# Patient Record
Sex: Male | Born: 1940 | Race: White | Hispanic: No | Marital: Married | Smoking: Current every day smoker
Health system: Southern US, Community
[De-identification: ages and names within clinical notes are randomized; demographics above are authoritative.]

## PROBLEM LIST (undated history)

## (undated) DIAGNOSIS — R197 Diarrhea, unspecified: Secondary | ICD-10-CM

## (undated) DIAGNOSIS — K589 Irritable bowel syndrome without diarrhea: Secondary | ICD-10-CM

## (undated) DIAGNOSIS — M79606 Pain in leg, unspecified: Secondary | ICD-10-CM

## (undated) DIAGNOSIS — I10 Essential (primary) hypertension: Secondary | ICD-10-CM

## (undated) DIAGNOSIS — I6529 Occlusion and stenosis of unspecified carotid artery: Secondary | ICD-10-CM

## (undated) DIAGNOSIS — E785 Hyperlipidemia, unspecified: Secondary | ICD-10-CM

## (undated) DIAGNOSIS — G459 Transient cerebral ischemic attack, unspecified: Secondary | ICD-10-CM

## (undated) HISTORY — DX: Occlusion and stenosis of unspecified carotid artery: I65.29

## (undated) HISTORY — DX: Hyperlipidemia, unspecified: E78.5

## (undated) HISTORY — DX: Diarrhea, unspecified: R19.7

## (undated) HISTORY — PX: STRABISMUS SURGERY: SHX218

## (undated) HISTORY — DX: Essential (primary) hypertension: I10

## (undated) HISTORY — DX: Transient cerebral ischemic attack, unspecified: G45.9

## (undated) HISTORY — DX: Pain in leg, unspecified: M79.606

## (undated) HISTORY — DX: Irritable bowel syndrome, unspecified: K58.9

---

## 2002-05-03 ENCOUNTER — Encounter: Payer: Self-pay | Admitting: Cardiology

## 2003-09-28 ENCOUNTER — Ambulatory Visit (HOSPITAL_COMMUNITY): Admission: RE | Admit: 2003-09-28 | Discharge: 2003-09-28 | Payer: Self-pay | Admitting: Family Medicine

## 2009-01-03 ENCOUNTER — Ambulatory Visit (HOSPITAL_COMMUNITY): Admission: RE | Admit: 2009-01-03 | Discharge: 2009-01-03 | Payer: Self-pay | Admitting: Family Medicine

## 2009-01-03 ENCOUNTER — Encounter: Payer: Self-pay | Admitting: Cardiology

## 2009-09-04 ENCOUNTER — Ambulatory Visit: Payer: Self-pay | Admitting: Cardiology

## 2009-09-04 ENCOUNTER — Encounter: Payer: Self-pay | Admitting: Cardiology

## 2009-09-05 ENCOUNTER — Encounter: Payer: Self-pay | Admitting: Cardiology

## 2009-09-06 ENCOUNTER — Encounter: Payer: Self-pay | Admitting: Cardiology

## 2009-09-09 ENCOUNTER — Encounter: Payer: Self-pay | Admitting: Cardiology

## 2009-09-09 DIAGNOSIS — G459 Transient cerebral ischemic attack, unspecified: Secondary | ICD-10-CM | POA: Insufficient documentation

## 2009-09-09 DIAGNOSIS — I739 Peripheral vascular disease, unspecified: Secondary | ICD-10-CM | POA: Insufficient documentation

## 2009-09-09 DIAGNOSIS — M79609 Pain in unspecified limb: Secondary | ICD-10-CM

## 2009-09-09 DIAGNOSIS — I10 Essential (primary) hypertension: Secondary | ICD-10-CM | POA: Insufficient documentation

## 2009-09-09 DIAGNOSIS — D649 Anemia, unspecified: Secondary | ICD-10-CM | POA: Insufficient documentation

## 2009-09-10 ENCOUNTER — Ambulatory Visit (HOSPITAL_COMMUNITY): Admission: RE | Admit: 2009-09-10 | Discharge: 2009-09-10 | Payer: Self-pay | Admitting: Family Medicine

## 2009-09-16 ENCOUNTER — Encounter: Payer: Self-pay | Admitting: Cardiology

## 2009-09-16 ENCOUNTER — Ambulatory Visit: Payer: Self-pay | Admitting: Surgery

## 2009-09-19 ENCOUNTER — Encounter: Payer: Self-pay | Admitting: Cardiology

## 2009-09-19 DIAGNOSIS — N259 Disorder resulting from impaired renal tubular function, unspecified: Secondary | ICD-10-CM | POA: Insufficient documentation

## 2009-09-20 ENCOUNTER — Encounter: Payer: Self-pay | Admitting: Cardiology

## 2009-09-20 ENCOUNTER — Ambulatory Visit: Payer: Self-pay

## 2009-09-20 DIAGNOSIS — K551 Chronic vascular disorders of intestine: Secondary | ICD-10-CM | POA: Insufficient documentation

## 2009-09-24 ENCOUNTER — Ambulatory Visit: Payer: Self-pay | Admitting: Cardiology

## 2009-09-24 DIAGNOSIS — F17200 Nicotine dependence, unspecified, uncomplicated: Secondary | ICD-10-CM

## 2009-09-24 DIAGNOSIS — R930 Abnormal findings on diagnostic imaging of skull and head, not elsewhere classified: Secondary | ICD-10-CM

## 2009-09-24 DIAGNOSIS — E785 Hyperlipidemia, unspecified: Secondary | ICD-10-CM

## 2009-09-24 DIAGNOSIS — R55 Syncope and collapse: Secondary | ICD-10-CM

## 2009-09-24 DIAGNOSIS — I6529 Occlusion and stenosis of unspecified carotid artery: Secondary | ICD-10-CM

## 2009-10-03 ENCOUNTER — Ambulatory Visit: Payer: Self-pay | Admitting: Surgery

## 2009-10-03 ENCOUNTER — Encounter: Payer: Self-pay | Admitting: Surgery

## 2009-10-03 ENCOUNTER — Inpatient Hospital Stay (HOSPITAL_COMMUNITY): Admission: RE | Admit: 2009-10-03 | Discharge: 2009-10-04 | Payer: Self-pay | Admitting: Surgery

## 2009-10-03 HISTORY — PX: CAROTID ENDARTERECTOMY: SUR193

## 2009-10-04 ENCOUNTER — Encounter: Payer: Self-pay | Admitting: Cardiology

## 2009-10-07 ENCOUNTER — Encounter: Payer: Self-pay | Admitting: Cardiology

## 2009-10-07 ENCOUNTER — Ambulatory Visit: Payer: Self-pay | Admitting: Surgery

## 2009-10-14 ENCOUNTER — Ambulatory Visit: Payer: Self-pay | Admitting: Surgery

## 2009-10-14 ENCOUNTER — Encounter: Payer: Self-pay | Admitting: Cardiology

## 2009-10-16 ENCOUNTER — Encounter: Payer: Self-pay | Admitting: Cardiology

## 2009-10-17 ENCOUNTER — Encounter: Payer: Self-pay | Admitting: Cardiology

## 2009-10-17 ENCOUNTER — Ambulatory Visit: Payer: Self-pay

## 2010-01-31 ENCOUNTER — Ambulatory Visit: Payer: Self-pay | Admitting: Cardiology

## 2010-01-31 DIAGNOSIS — J984 Other disorders of lung: Secondary | ICD-10-CM

## 2010-05-05 ENCOUNTER — Ambulatory Visit: Payer: Self-pay | Admitting: Surgery

## 2010-05-05 DIAGNOSIS — R197 Diarrhea, unspecified: Secondary | ICD-10-CM

## 2010-05-05 HISTORY — DX: Diarrhea, unspecified: R19.7

## 2010-09-09 NOTE — Assessment & Plan Note (Signed)
Summary: post hosp   Visit Type:  Follow-up Primary Provider:  Dr. Earley Favor  CC:  syncope.  History of Present Illness: The patient presents for follow up after hospitalization for syncope last month.  He had an extensive work up to include carotid dopplers, echo, nuclear stress testing.  Echo demonstrated an EF of 60% without valvular or other abnormality.  CL demonstrated and EF of 62% with possible fixed apical defect.  Carotid showed right 50% and left 80% stenosis.  The syncope was felt to be vasovagal.  I have reviewed all hospital and outpatient studies and records.  Of note the patient is now due to have a left carotid endarterectomy by Dr. Myra Gianotti.  I do note that a renal ultrasound done in our office suggested possible mesenteric stenosis but no renal artery stenosis. Dr. Andee Lineman suggested possible repeat evaluation with a food challenge but this has not yet been scheduled.  The patient reports that he has had no other cardiovascular problems. He does have claudication which is being evaluated. However, he is not having any chest pressure, neck or arm discomfort. He has had no palpitations, presyncope or syncope. He has had no shortness of breath, PND or orthopnea. Unfortunately he is still smoking cigarettes.  Preoperative Cardiovascular Risk Evaluation:  Clinical Predictors of Increased Perioperative Cardiovascular Risk (MI, CHF, Death):     Minor Risk:    Advanced age (>65 years):     yes   Preventive Screening-Counseling & Management  Alcohol-Tobacco     Smoking Status: current     Packs/Day: 1.5     Year Started: 1951  Current Medications (verified): 1)  Enalapril-Hydrochlorothiazide 10-25 Mg Tabs (Enalapril-Hydrochlorothiazide) .... Take 1 Tablet By Mouth Once A Day 2)  Aspirin 81 Mg Tbec (Aspirin) .... Take One Tablet By Mouth Daily 3)  Simvastatin 40 Mg Tabs (Simvastatin) .... Take One Tablet By Mouth Daily At Bedtime  Allergies (verified): No Known Drug  Allergies  Comments:  Nurse/Medical Assistant: The patient's medications were reviewed with the patient and were updated in the Medication List. Pt verbally confirmed medications.  Cyril Loosen, RN, BSN (September 24, 2009 11:13 AM)  Past History:  Past Medical History: Hypertension x years Dyslipidemia x years Renal Insufficiency Hx of Stroke in 2005 Chronic diarrhea Peripheral arterial disease (Right 50 - 69%, Left 80%)  Past Surgical History: Colon polypectomy, June 2010  Social History: Smoking Status:  current Packs/Day:  1.5  Review of Systems       As stated in the HPI and negative for all other systems.   Vital Signs:  Patient profile:   70 year old male Height:      68 inches Weight:      153.25 pounds BMI:     23.39 Pulse rate:   72 / minute BP sitting:   128 / 83  (left arm) Cuff size:   regular  Vitals Entered By: Cyril Loosen, RN, BSN (September 24, 2009 11:10 AM) CC: syncope Comments Post hospital follow up   Physical Exam  General:  Well developed, well nourished, in no acute distress. Head:  normocephalic and atraumatic Eyes:  PERRLA/EOM intact; conjunctiva and lids normal. Mouth:  Teeth, gums and palate normal. Oral mucosa normal. Neck:  Neck supple, no JVD. No masses, thyromegaly or abnormal cervical nodes. Chest Wall:  no deformities or breast masses noted Lungs:  Clear bilaterally to auscultation and percussion.   Detailed Cardiovascular Exam  Neck    Carotids: Carotids full and equal bilaterally without  bruits.      Neck Veins: Normal, no JVD.    Heart    Inspection: no deformities or lifts noted.      Palpation: normal PMI with no thrills palpable.      Auscultation: regular rate and rhythm, S1, S2 without murmurs, rubs, gallops, or clicks.    Vascular    Abdominal Aorta: no palpable masses, pulsations, or audible bruits.      Femoral Pulses: normal femoral pulses bilaterally.      Pedal Pulses: absent right dorsalis pedis  pulse, absent right posterior tibial pulse, absent left dorsalis pedis pulse, and absent left posterior tibial pulse.      Radial Pulses: normal radial pulses bilaterally.      Peripheral Circulation: no clubbing, cyanosis, or edema noted with normal capillary refill.     Impression & Recommendations:  Problem # 1:  SYNCOPE (ICD-780.2) The patient has had no further events. This was thought to be vasovagal. No further workup is planned.  Problem # 2:  ABNORMAL CHEST XRAY (ICD-793.1) There was a questionable lung nodule noted in the right midlung. I gave a copy of this report to the patient. He is aware that followup is indicated and I will send this note to his primary care doctor. I asked him also to present the printed copy of the chest x-ray at his next appointment. He is aware of these instructions and the need for followup.  Problem # 3:  TOBACCO ABUSE (ICD-305.1) We spent quite a bit of time discussing the need to stop smoking. (Greater than 3 minutes). He wants to try "cold [place]".  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 I will defer further studies to Drs Andee Lineman and Myra Gianotti.  Problem # 5:  RENAL INSUFFICIENCY (ICD-588.9) We discussed his renal insufficiency. This will be followed by his primary physician.  Problem # 6:  DYSLIPIDEMIA (ICD-272.4) I did not see the most recent lipid profile but he tells me his statin was increased in the hospital. The goal should be an LDL less than 70 and HDL greater than 40 given his significant vascular disease.  Patient Instructions: 1)  Your physician recommends that you continue on your current medications as directed. Please refer to the Current Medication list given to you today. 2)  Your physician wants you to follow-up in:4 months with Dr. Andee Lineman.  You will receive a reminder letter in the mail about two months in advance. If you don't receive a letter, please call our office  to schedule the follow-up appointment.

## 2010-09-09 NOTE — Miscellaneous (Signed)
Summary: Orders Update  Clinical Lists Changes  Problems: Added new problem of RENAL INSUFFICIENCY (ICD-588.9) Orders: Added new Referral order of Ultrasound (Ultrasound) - Signed

## 2010-09-09 NOTE — Consult Note (Signed)
Summary: CARDIOLOGY CONSULT/ [facility]  CARDIOLOGY CONSULT/ [facility]   Imported By: Zachary George 09/09/2009 15:31:50  _____________________________________________________________________  External Attachment:    Type:   Image     Comment:   External Document

## 2010-09-09 NOTE — Miscellaneous (Signed)
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

## 2010-09-09 NOTE — Miscellaneous (Signed)
Summary: Orders Update  Clinical Lists Changes  Orders: Added new Test order of Renal Artery Duplex (Renal Artery Duplex) - Signed 

## 2010-09-09 NOTE — Letter (Signed)
Summary: ORDER FOR RENAL US  ORDER FOR RENAL US   Imported By: Zachary George 09/09/2009 15:31:27  _____________________________________________________________________  External Attachment:    Type:   Image     Comment:   External Document

## 2010-09-09 NOTE — Miscellaneous (Signed)
Summary: Orders Update  Clinical Lists Changes  Orders: Added new Test order of Mesenteric (Mesenteric) - Signed 

## 2010-09-09 NOTE — Miscellaneous (Signed)
Summary: MESENTERIC U/S WITH FOOD CHALLANGE  Clinical Lists Changes  Orders: Added new Referral order of Ultrasound (Ultrasound) - Signed

## 2010-09-09 NOTE — Miscellaneous (Signed)
Summary: Orders Update  Clinical Lists Changes 

## 2010-09-09 NOTE — Letter (Signed)
Summary: Internal Correspondence/ [facility]  Internal Correspondence/ [facility]   Imported By: Dorise Hiss 02/05/2010 11:16:19  _____________________________________________________________________  External Attachment:    Type:   Image     Comment:   External Document

## 2010-09-09 NOTE — Assessment & Plan Note (Signed)
Summary: 4 MO FU PER JUNE REMINDER-SRS   Visit Type:  Follow-up Primary Provider:  Dr. Earley Favor   History of Present Illness: the patient is 70 year old male with a history of peripheral vascular disease. The patient had a recent left carotid endarterectomy done byvascular surgery. He also reports lifestyle limiting claudication the right leg. He states that he can only walk for 15-20 minutes in a plowed field. He develops claudication after 100 yards of walking. he reports no chest pain. The patient a Cardiolite imaging study done which was negative for ischemia. Unfortunately continues to smoke one to one and a half packs a day. He also has a pulmonary nodule on his chest x-ray which requires followup. From cardiac standpoint is stable and he denies any chest pain shortness of breath orthopnea PND palpitations or syncope.  Preventive Screening-Counseling & Management  Alcohol-Tobacco     Smoking Status: current     Smoking Cessation Counseling: yes     Packs/Day: 1 PPD  Current Medications (verified): 1)  Enalapril-Hydrochlorothiazide 10-25 Mg Tabs (Enalapril-Hydrochlorothiazide) .... Take 1 Tablet By Mouth Once A Day 2)  Aspirin 81 Mg Tbec (Aspirin) .... Take One Tablet By Mouth Daily 3)  Simvastatin 40 Mg Tabs (Simvastatin) .... Take One Tablet By Mouth Daily At Bedtime 4)  Plavix 75 Mg Tabs (Clopidogrel Bisulfate) .... Take 1 Tablet By Mouth Once A Day  Allergies (verified): No Known Drug Allergies  Comments:  Nurse/Medical Assistant: The patient's medication list and allergies were reviewed with the patient and were updated in the Medication and Allergy Lists.  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 Married  has 5 children from 1st marriage Smoking one pack a day and started age 50 denies alcohol use for the past 15 years but previously drank as much as a six pack of beer per day  Risk Factors: Smoking Status: current (01/31/2010) Packs/Day: 1 PPD (01/31/2010)  Social History: Packs/Day:  1 PPD  Vital Signs:  Patient profile:   70 year old male Height:      68 inches Weight:      152 pounds Pulse rate:   72 / minute BP sitting:   126 / 76  (left arm) Cuff size:   regular  Vitals Entered By: Carlye Grippe (January 31, 2010 1:42 PM)  Physical Exam  Additional Exam:  General: Well-developed, well-nourished in no distress head: Normocephalic and atraumatic eyes PERRLA/EOMI intact, conjunctiva and lids normal nose: No deformity or lesions mouth normal dentition, normal posterior pharynx neck: Supple, no JVD.  No masses, thyromegaly or abnormal cervical nodes lungs: Normal breath sounds bilaterally without wheezing.  Normal percussion heart: regular rate and rhythm with normal S1 and S2, no S3 or S4.  PMI is normal.  No pathological murmurs abdomen: Normal bowel sounds, abdomen is soft and nontender without masses, organomegaly or hernias noted.  No hepatosplenomegaly musculoskeletal: Back normal, normal gait muscle strength and tone normal pulsus: difficult to palpate pulses in dorsalis pedis posterior tibial in both lower extremities. Extremities: No peripheral pitting edema neurologic: Alert and oriented x 3 skin: Intact without  lesions or rashes cervical nodes: No significant adenopathy psychologic: Normal affect    Impression & Recommendations:  Problem # 1:  DYSLIPIDEMIA (ICD-272.4) followed by the patient's primary care physician His updated medication list for this  problem includes:    Simvastatin 40 Mg Tabs (Simvastatin) .Marland Kitchen... Take one tablet by mouth daily at bedtime  Problem # 2:  ABNORMAL CHEST XRAY (ICD-793.1) patient was referred for repeat chest x-ray to followup on this pulmonary nodule  Problem # 3:  TOBACCO ABUSE (ICD-305.1) I strongly encouraged the patient again to stop smoking.  Problem # 4:  CAROTID STENOSIS (ICD-433.10) the patient is status post left carotid endarterectomy. His updated medication list for this problem includes:    Aspirin 81 Mg Tbec (Aspirin) .Marland Kitchen... Take one tablet by mouth daily    Plavix 75 Mg Tabs (Clopidogrel bisulfate) .Marland Kitchen... 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 If there is any evidence of iliofemoral disease the CT scan will be ordered.  Other Orders: T-Chest x-ray, 2 views ([id number])  Patient Instructions: 1)  Chest x-ray  today 2)  Follow up in  6 months

## 2010-09-09 NOTE — Letter (Signed)
Summary: [facility] D/C SUMMARY DR. Doyne Keel  [facility]C SUMMARY DR. Doyne Keel   Imported By: Zachary George 09/09/2009 15:35:44  _____________________________________________________________________  External Attachment:    Type:   Image     Comment:   External Document

## 2010-09-09 NOTE — Letter (Signed)
Summary: Internal Correspondence/ [facility]  Internal Correspondence/ [facility]   Imported By: Dorise Hiss 02/05/2010 11:30:29  _____________________________________________________________________  External Attachment:    Type:   Image     Comment:   External Document

## 2010-10-30 LAB — CBC
HCT: 28.7 % — ABNORMAL LOW (ref 39.0–52.0)
Hemoglobin: 12.8 g/dL — ABNORMAL LOW (ref 13.0–17.0)
Hemoglobin: 9.9 g/dL — ABNORMAL LOW (ref 13.0–17.0)
MCHC: 34.5 g/dL (ref 30.0–36.0)
MCV: 84.6 fL (ref 78.0–100.0)
Platelets: 141 10*3/uL — ABNORMAL LOW (ref 150–400)
RBC: 4.42 MIL/uL (ref 4.22–5.81)
RDW: 15.2 % (ref 11.5–15.5)
WBC: 8.5 10*3/uL (ref 4.0–10.5)

## 2010-10-30 LAB — COMPREHENSIVE METABOLIC PANEL
ALT: 33 U/L (ref 0–53)
Alkaline Phosphatase: 88 U/L (ref 39–117)
CO2: 25 mEq/L (ref 19–32)
GFR calc non Af Amer: 29 mL/min — ABNORMAL LOW (ref >60)
Glucose, Bld: 75 mg/dL (ref 70–99)
Potassium: 4.9 mEq/L (ref 3.5–5.1)
Sodium: 140 mEq/L (ref 135–145)

## 2010-10-30 LAB — URINALYSIS, ROUTINE W REFLEX MICROSCOPIC
Glucose, UA: NEGATIVE mg/dL
Ketones, ur: NEGATIVE mg/dL
Protein, ur: NEGATIVE mg/dL
pH: 5.5 (ref 5.0–8.0)

## 2010-10-30 LAB — URINE MICROSCOPIC-ADD ON

## 2010-10-30 LAB — TYPE AND SCREEN: ABO/RH(D): A POS

## 2010-10-30 LAB — BASIC METABOLIC PANEL
BUN: 27 mg/dL — ABNORMAL HIGH (ref 6–23)
CO2: 22 mEq/L (ref 19–32)
GFR calc non Af Amer: 33 mL/min — ABNORMAL LOW (ref >60)
Glucose, Bld: 150 mg/dL — ABNORMAL HIGH (ref 70–99)
Potassium: 4.6 mEq/L (ref 3.5–5.1)

## 2010-10-30 LAB — PROTIME-INR: Prothrombin Time: 13.6 seconds (ref 11.6–15.2)

## 2010-10-30 LAB — ABO/RH: ABO/RH(D): A POS

## 2010-12-01 ENCOUNTER — Other Ambulatory Visit: Payer: Self-pay | Admitting: Family Medicine

## 2010-12-04 ENCOUNTER — Other Ambulatory Visit: Payer: Self-pay | Admitting: Family Medicine

## 2010-12-23 NOTE — Assessment & Plan Note (Signed)
OFFICE VISIT   Eugene Mack, Eugene Mack  DOB:  07/04/1941                                       10/14/2009  CHART#:[id number]   REASON FOR VISIT:  Follow-up.   Patient is a 70 year old gentleman who underwent a left carotid  endarterectomy on February 24th.  This was done in the setting of a  syncopal workup that revealed a high-grade left carotid stenosis.  I  felt he was asymptomatic from a carotid perspective.  When I saw him  back postoperatively, he had a hematoma over his incision site.  There  had been some drainage.  He had not been having any fevers.  I did probe  the area with a Q-tip.  There was no cavity.  I placed him on  antibiotics and told him to come back to see me in a week.  He comes  back today for follow-up.  He comes with his wife today, who tells me  that last week he had an episode where he could not speak, and it  appears that he blacked out.  It lasted approximately 5-6 minutes.  The  patient refused go to the ER.  The patient does have a prior episode of  passing out, which led to his syncopal workup.  He is back to his  baseline currently.   The patient's wound has completely closed.  There is still a hematoma  present.  However, there is no evidence of infection.   I had the patient get a duplex ultrasound today.  This shows mild  intimal thickening in the common and internal carotid artery.  There are  no intimal flaps.  There is a 1 x 1 hematoma, which is not connected to  the carotid.   At this point, I do not feel that the patient's episode is related to  his recent operation.  The etiology is a little unclear.  I did go ahead  and place the patient on Plavix today.  I have told him to get in touch  with Dr. Samul Dada office to see if any additional testing needs to be  done for his syncopal episode.  Otherwise, I will plan on having him  come back to see me in 6 months.     Jorge Ny, MD  Electronically  Signed   VWB/MEDQ  D:  10/14/2009  T:  10/15/2009  Job:  [id number]   cc:   Dr. Milinda Cave

## 2010-12-23 NOTE — Procedures (Signed)
CAROTID DUPLEX EXAM   INDICATION:  Follow up of left carotid due to fainting.   HISTORY:  Diabetes:  no  Cardiac:  no  Hypertension:  yes  Smoking:  yes  Previous Surgery:  Left carotid endarterectomy 10/02/2009.  CV History:  Fainting  Amaurosis Fugax  Paresthesias  Hemiparesis                                       RIGHT             LEFT  Brachial systolic pressure:  Brachial Doppler waveforms:  Vertebral direction of flow:                          Not identified  DUPLEX VELOCITIES (cm/sec)  CCA peak systolic                                     49  ECA peak systolic                                     219  ICA peak systolic                                     24  ICA end diastolic                                     15  PLAQUE MORPHOLOGY:                                    Intimal thickening  PLAQUE AMOUNT:                                        Mild  PLAQUE LOCATION:                                      CCA/ICA   IMPRESSION:  1. Mild intimal plaque thickening of the common carotid artery and      internal carotid artery noted.  2. Left external carotid artery stenosis.  3. Hematoma measuring 1.05 x 0.93 cm, not connected to the carotid      plane.   ___________________________________________  V. Charlena Cross, MD   CJ/MEDQ  D:  10/14/2009  T:  10/14/2009  Job:  [id number]

## 2010-12-23 NOTE — Assessment & Plan Note (Signed)
OFFICE VISIT   Eugene, Mack  DOB:  1941-02-25                                       05/05/2010  CHART#:[id number]   REASON FOR VISIT:  Follow up carotid stenosis.   HISTORY:  This is a 70 year old gentleman status post left carotid  endarterectomy on 10/03/2009.  At that time, the patient was found to  have a high-grade left carotid stenosis during his syncopal workup.  He  underwent a carotid endarterectomy without incident.  I did see him  about a month after his operation, and he described an episode where he  had difficulty speaking and apparently blacked out.  At that time he  refused to go the emergency department.  When I saw him he not had any  referral of symptoms.  His endarterectomy site looked fine.  I placed  him on Plavix.  He has had no episodes since that time.  He continues to  take his Plavix.  Overall he is feeling very well and doing without  complaints.   PHYSICAL EXAMINATION:  His heart rate is 81, blood pressure 162/81.  O2  sat is 98%.  General:  He is well-appearing in no distress.  Respirations are nonlabored.  Cardiovascular:  Regular rate and rhythm.  The left carotid incision site is well-healed.  He is neurologically  intact.   DIAGNOSTIC STUDIES:  I have ordered and reviewed his ultrasound.  This  shows a widely patent left carotid endarterectomy site without stenosis.  The right proximal carotid shows 40% to 59% stenosis.   ASSESSMENT:  Status post left carotid endarterectomy.   PLAN:  The patient is doing very well at this time.  I have recommended  continuing his Plavix for a stroke prevention.  I will plan on seeing  him back in a year with a repeat carotid ultrasound.     Eugene Ny, MD  Electronically Signed   VWB/MEDQ  D:  05/05/2010  T:  05/06/2010  Job:  [id number]   cc:   Eugene Massed, MD

## 2010-12-23 NOTE — Procedures (Signed)
CAROTID DUPLEX EXAM   INDICATION:  Followup left carotid endarterectomy.   HISTORY:  Diabetes:  No.  Cardiac:  No.  Hypertension:  Yes.  Smoking:  Yes.  Previous Surgery:  Left carotid endarterectomy on 10/02/2009.  CV History:  Currently asymptomatic.  Amaurosis Fugax No, Paresthesias No, Hemiparesis No                                       RIGHT             LEFT  Brachial systolic pressure:         144               148  Brachial Doppler waveforms:         Normal            Normal  Vertebral direction of flow:        Antegrade         Antegrade  DUPLEX VELOCITIES (cm/sec)  CCA peak systolic                   105               66  ECA peak systolic                   67                267  ICA peak systolic                   132               59  ICA end diastolic                   47                25  PLAQUE MORPHOLOGY:                  Heterogeneous     Heterogeneous  PLAQUE AMOUNT:                      Mild / moderate   Mild / moderate  PLAQUE LOCATION:                    ICA / ECA / CCA   ECA / CCA   IMPRESSION:  1. Doppler velocities suggest 40%-59% stenosis of the right proximal      internal carotid artery.  2. Patent left carotid endarterectomy site with no left internal      carotid artery stenosis.  3. Significant improvement of the bilateral internal carotid artery      Doppler velocities when compared to the previous exam on      09/16/2009.   ___________________________________________  V. Charlena Cross, MD   CH/MEDQ  D:  05/05/2010  T:  05/05/2010  Job:  [id number]

## 2010-12-23 NOTE — Assessment & Plan Note (Signed)
OFFICE VISIT   Eugene Mack, Eugene Mack  DOB:  23-Oct-1940                                       09/16/2009  [id number]#:[id number]   REFERRING PHYSICIAN:  Dr. Milinda Cave.   REASON FOR VISIT:  Carotid disease.   HISTORY:  This is a 70 year old gentleman I am seeing at the request of  Dr. Milinda Cave for evaluation of carotid stenosis.  The patient recently  presented to the emergency department at [facility] on January 26th.  At  that time, he felt pain across his upper abdomen and then he passed out  for approximately 5 minutes, according to his wife.  When he regained  consciousness, he was back to his baseline state in the hospital.  He  had a full syncopal workup without identifiable cause.  A carotid  ultrasound did reveal high-grade left carotid stenosis.   The patient also complains of claudication, right leg greater than left.  He suffers from hypertension, hypercholesterolemia, both of which are  medically managed.   PAST MEDICAL HISTORY:  Irritable bowel syndrome, hypertension,  hypercholesterolemia, intermittent claudication, history of TIA.   FAMILY HISTORY:  Positive for cardiovascular disease in an early age in  his father.   SOCIAL HISTORY:  He is married with 5 children.  He is retired.  Currently smokes a half-pack a day.  Does not drink.   REVIEW OF SYSTEMS:  Positive for pain in his legs with walking.  All  other systems are negative, as documented in the encounter form except  what was detailed in the HPI.   PHYSICAL EXAMINATION:  Blood pressure is 130/73, pulse 85, temperature  97.8.  General:  He is well-appearing in no distress.  HEENT within  normal limits.  Lungs are clear bilaterally.  Cardiovascular:  Regular  rate and rhythm.  No carotid bruits.  No pedal pulses.  Abdomen:  Soft,  nontender.  Musculoskeletal is without major deformities.  Neuro:  He  has no focal deficits, weakness, or paresthesias.  Skin is without rash.   DIAGNOSTIC  STUDIES:  Carotid duplex has been independently reviewed.  This shows an internal carotid stenosis 60% to 79% on the right and 80%  to 99% on the left.  Bifurcation is at mid hyoid.  The stenosis extends  from the bifurcation approximately 3.5 cm.   ASSESSMENT/PLAN:  Asymptomatic left carotid stenosis.  The risks and  benefits of the procedure with carotid endarterectomy were discussed  with the patient, including the risk of stroke, the risk of nerve  injury, the risk of bleeding, and any complications from anesthesia.  The patient has had a cardiac workup at [facility] and told to have normal  cardiovascular function.  The patient's disease does extend distally  approximately 3.5 cm from the bifurcation.  He is being managed  appropriately from a medical perspective.  We have decided to proceed  with a carotid endarterectomy on Thursday, February 24th.     Jorge Ny, MD  Electronically Signed   VWB/MEDQ  D:  09/16/2009  T:  09/17/2009  Job:  [id number]   cc:   Jeoffrey Massed, MD

## 2010-12-23 NOTE — Procedures (Signed)
CAROTID DUPLEX EXAM   INDICATION:  Follow up carotid artery disease.   HISTORY:  Diabetes:  no  Cardiac:  no  Hypertension:  yes  Smoking:  yes  Previous Surgery:  no  CV History:  Last Wednesday passed out, asymptomatic now. Possible  stroke 3-4 years ago per patient.  Amaurosis Fugax No, Paresthesias No, Hemiparesis No                                       RIGHT             LEFT  Brachial systolic pressure:         138               142  Brachial Doppler waveforms:         WNL               WNL  Vertebral direction of flow:        antegrade         antegrade  DUPLEX VELOCITIES (cm/sec)  CCA peak systolic                   128               84  ECA peak systolic                   118               198  ICA peak systolic                   207               P=54, M=472  ICA end diastolic                   67                P=15, M=185  PLAQUE MORPHOLOGY:                  mixed             mixed  PLAQUE AMOUNT:                      severe            severe  PLAQUE LOCATION:                    ICA/CCA/ECA       ICA/ECA/CCA   IMPRESSION:  1. Right internal carotid artery shows evidence of 60% to 79% stenosis      (low end of range).  2. Left internal carotid artery shows evidence of 80% to 99% stenosis      mid internal carotid artery.  3. Left external carotid artery stenosis.   ___________________________________________  V. Charlena Cross, MD   AS/MEDQ  D:  09/16/2009  T:  09/17/2009  Job:  [id number]

## 2010-12-23 NOTE — Assessment & Plan Note (Signed)
OFFICE VISIT   Eugene Mack, Eugene Mack  DOB:  10/21/1940                                       10/07/2009  CBJSE#:[id number]   The patient returns having undergone left carotid endarterectomy on  02/24.  He has not had any neurologic symptoms at home, however, he has  had some drainage from his incision.  He has not had any fevers.   PHYSICAL EXAMINATION:  There is a fullness over his incision.  It is  very tender to the touch.  There is no surrounding erythema.  The  superior aspect of the incision is slightly separated.  I removed some  of the Dermabond and probed the wound.  The tip of a Q-tip was  introduced however it did not go anywhere.  There was no purulent  drainage.   I have recommended putting him on antibiotics for a week and having him  return.  He is going to call me if anything changes.     Jorge Ny, MD  Electronically Signed   VWB/MEDQ  D:  10/07/2009  T:  10/08/2009  Job:  [id number]

## 2011-03-31 ENCOUNTER — Encounter: Payer: Self-pay | Admitting: Surgery

## 2011-05-04 ENCOUNTER — Other Ambulatory Visit: Payer: Self-pay

## 2011-05-04 ENCOUNTER — Ambulatory Visit: Payer: Self-pay | Admitting: Surgery

## 2011-07-06 ENCOUNTER — Ambulatory Visit (INDEPENDENT_AMBULATORY_CARE_PROVIDER_SITE_OTHER): Payer: Medicare Other | Admitting: Surgery

## 2011-07-06 ENCOUNTER — Ambulatory Visit (INDEPENDENT_AMBULATORY_CARE_PROVIDER_SITE_OTHER): Payer: Medicare Other | Admitting: *Deleted

## 2011-07-06 ENCOUNTER — Encounter: Payer: Self-pay | Admitting: Surgery

## 2011-07-06 VITALS — BP 141/90 | HR 97 | Resp 16 | Ht 69.0 in | Wt 161.0 lb

## 2011-07-06 DIAGNOSIS — I6529 Occlusion and stenosis of unspecified carotid artery: Secondary | ICD-10-CM

## 2011-07-06 DIAGNOSIS — Z48812 Encounter for surgical aftercare following surgery on the circulatory system: Secondary | ICD-10-CM

## 2011-07-06 DIAGNOSIS — I739 Peripheral vascular disease, unspecified: Secondary | ICD-10-CM

## 2011-07-06 MED ORDER — CILOSTAZOL 100 MG PO TABS: 100.0000 mg | ORAL_TABLET | Freq: Two times a day (BID) | ORAL | Status: AC

## 2011-07-06 NOTE — Progress Notes (Signed)
[facility] [place]   Patient name: Eugene Mack MRN: [id number] DOB: [birth year] Sex: male     Chief Complaint  Patient presents with  . Carotid    1 year follow up with duplex scan today    HISTORY OF PRESENT ILLNESS: The patient comes back today for followup. He is status post left carotid endarterectomy on 10/02/2009. At that time his high-grade stenosis was found during a syncopal workup. Postoperatively he had one episode where he had difficulty speaking and apparently blacked out. He refused to go to the emergency department. When I saw him back in the office he had not had any further episodes. I placed him on Plavix. He is back today for followup he is had no new neurologic symptoms. He does complain of his legs giving out with walking approximately 100 yards.  Past Medical History  Diagnosis Date  . Hypertension   . Leg pain     with walking  . Diarrhea 05/05/2010  . Hyperlipidemia   . TIA (transient ischemic attack)   . IBS (irritable bowel syndrome)   . Carotid artery occlusion     Past Surgical History  Procedure Date  . Carotid endarterectomy 10/03/2009    left    History   Social History  . Marital Status: Married    Spouse Name: N/A    Number of Children: N/A  . Years of Education: N/A   Occupational History  . Not on file.   Social History Main Topics  . Smoking status: Current Everyday Smoker -- 1.5 packs/day    Types: Cigarettes  . Smokeless tobacco: Not on file  . Alcohol Use: No  . Drug Use:   . Sexually Active:    Other Topics Concern  . Not on file   Social History Narrative  . No narrative on file    Family History  Problem Relation Age of Onset  . Heart disease Father     Allergies as of 07/06/2011  . (No Known Allergies)    Current Outpatient Prescriptions on File Prior to Visit  Medication Sig Dispense Refill  . aspirin 81 MG tablet Take 81 mg by mouth daily.        . clopidogrel (PLAVIX) 75 MG  tablet Take 75 mg by mouth daily.        . enalapril-hydrochlorothiazide (VASERETIC) 10-25 MG per tablet Take 1 tablet by mouth daily.        . simvastatin (ZOCOR) 40 MG tablet Take 40 mg by mouth at bedtime.           REVIEW OF SYSTEMS: Cardiovascular: No chest pain, chest pressure, palpitations, orthopnea, or dyspnea on exertion. Positive for claudication no ulcers no rest pain Pulmonary: No productive cough, asthma or wheezing. Neurologic: No weakness, paresthesias, aphasia, or amaurosis. No dizziness. Hematologic: No bleeding problems or clotting disorders. Musculoskeletal: No joint pain or joint swelling. Gastrointestinal: No blood in stool or hematemesis Genitourinary: No dysuria or hematuria. Psychiatric:: No history of major depression. Integumentary: No rashes or ulcers. Constitutional: No fever or chills.  PHYSICAL EXAMINATION:   Vital signs are BP 141/90  Pulse 97  Resp 16  Ht 5\' 9"  (1.753 m)  Wt 161 lb (73.029 kg)  BMI 23.78 kg/m2  SpO2 97% General: The patient appears their stated age. HEENT:  No gross abnormalities Pulmonary:  Non labored breathing Musculoskeletal: There are no major deformities. Neurologic: No focal weakness or paresthesias are detected, Skin: There are no ulcer or rashes noted.  Psychiatric: The patient has normal affect.   Diagnostic Studies Carotid ultrasound was performed today I reviewed it shows 1-39% right carotid disease with a widely patent left carotid artery   Assessment: Status post left carotid endarterectomy Bilateral claudication Plan: To place the patient on carotid ultrasound protocol his neck study will be in one year. The patient does have claudication in both legs however I do not feel this is lifestyle limiting. He is able to tolerate his limitations. He is not having symptoms of rest pain. We discussed what that actually means. I would not recommend intervention at this time due to the severity of his symptoms however I  do think he may benefit from a trial of cilostazol. Have given him a prescription we will evaluate how he is done when I see him back in one year  V. Charlena Cross, M.D. [facility] [place] Office: [phone] Pager:  [phone]

## 2011-07-27 NOTE — Procedures (Unsigned)
CAROTID DUPLEX EXAM  INDICATION:  Followup left CEA  HISTORY: Diabetes:  No Cardiac:  No Hypertension:  Yes Smoking:  Yes Previous Surgery:  Left CEA 10/02/2009 CV History: Amaurosis Fugax No, Paresthesias No, Hemiparesis No                                      RIGHT             LEFT Brachial systolic pressure:         138               132 Brachial Doppler waveforms:         WNL               WNL Vertebral direction of flow:        Antegrade         Antegrade DUPLEX VELOCITIES (cm/sec) CCA peak systolic                   122               63 ECA peak systolic                   109               326 ICA peak systolic                   132               27 ICA end diastolic                   34                11 PLAQUE MORPHOLOGY:                  Soft              Soft PLAQUE AMOUNT:                      Mild              Mild PLAQUE LOCATION:                    CCA/ICA           CEA/ECA  IMPRESSION: 1. 1%-39% right internal carotid artery plaquing. 2. Widely patent left carotid endarterectomy. 3. 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 4. Left external carotid artery stenosis is observed. 5. Bilateral vertebral arteries are within normal limits.  ___________________________________________ V. Charlena Cross, MD  LT/MEDQ  D:  07/06/2011  T:  07/06/2011  Job:  [id number]

## 2012-01-18 ENCOUNTER — Encounter (INDEPENDENT_AMBULATORY_CARE_PROVIDER_SITE_OTHER): Payer: Self-pay | Admitting: *Deleted

## 2012-01-18 ENCOUNTER — Encounter (INDEPENDENT_AMBULATORY_CARE_PROVIDER_SITE_OTHER): Payer: Self-pay

## 2012-02-15 IMAGING — CR DG CHEST 2V
2 series · 2 of 2 positions shown · non-contrast
Comparison: None.

CLINICAL DATA: Smoker.  Preoperative respiratory examination for
carotid surgery.

CHEST - 2 VIEW

[view not recorded (1 of 2)]
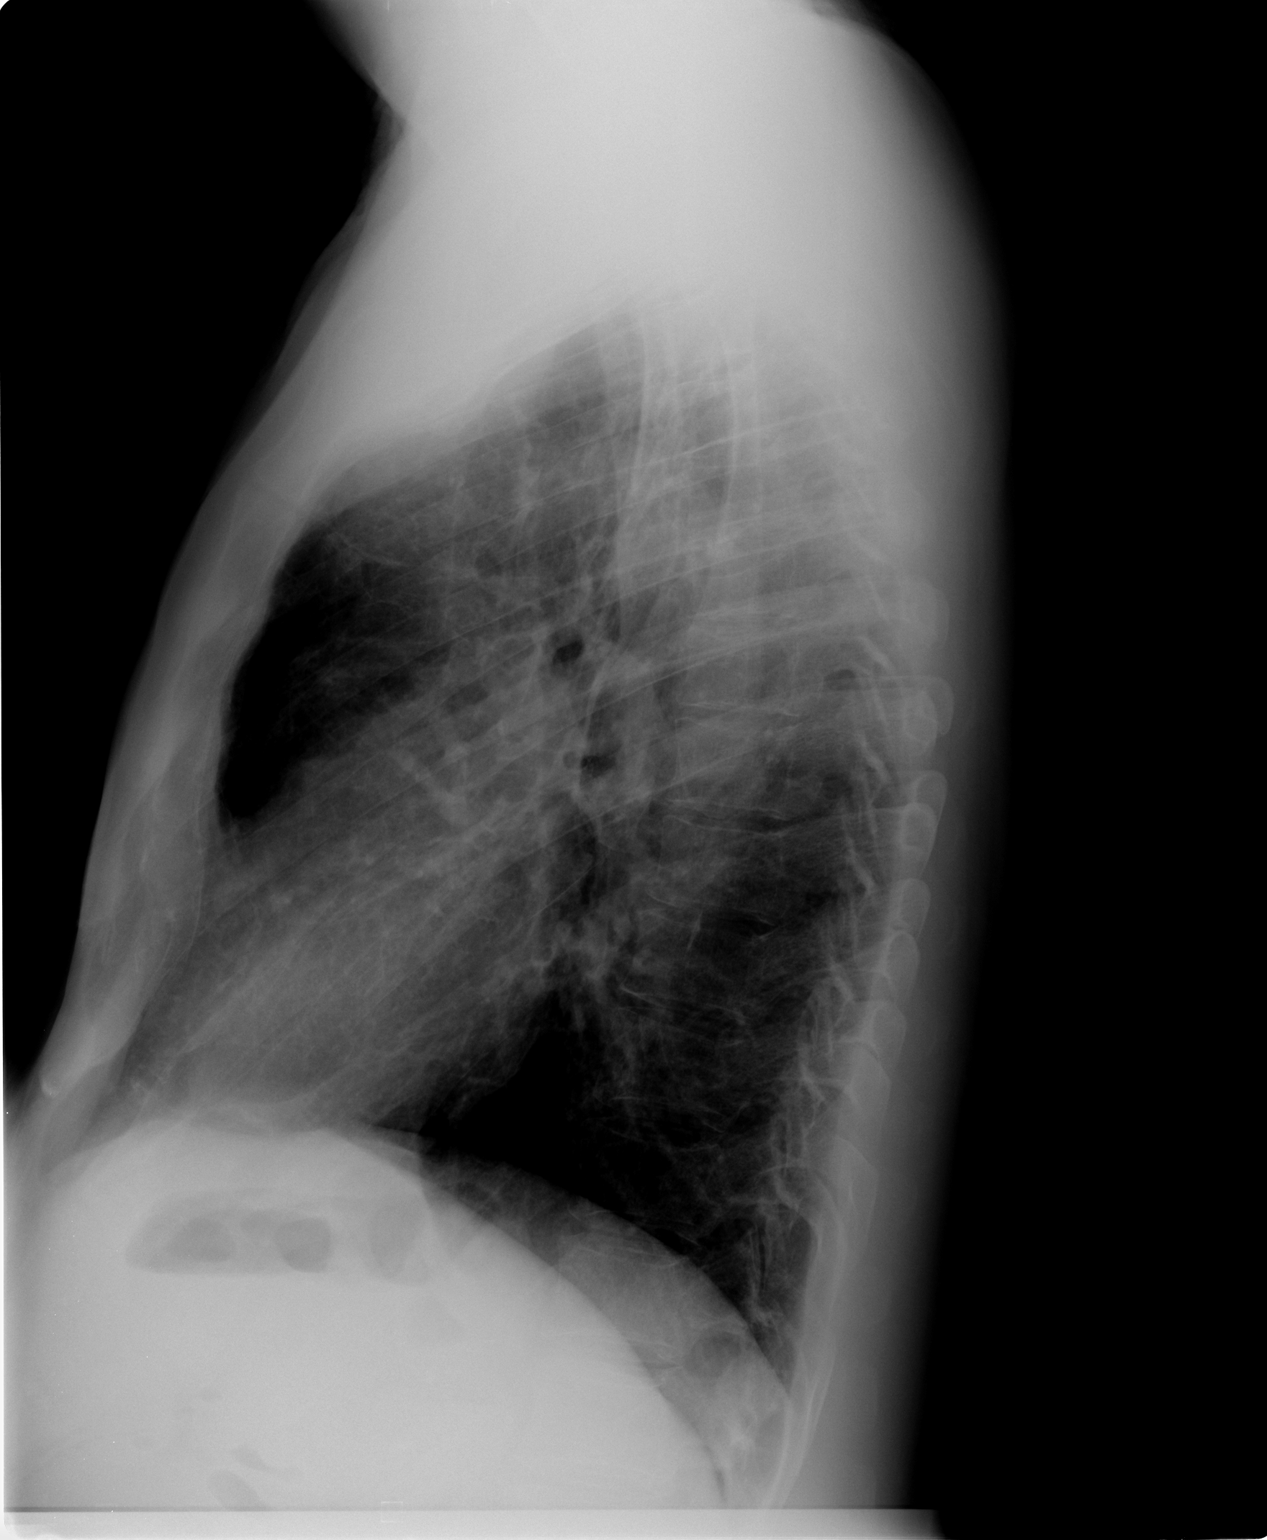

[view not recorded (2 of 2)]
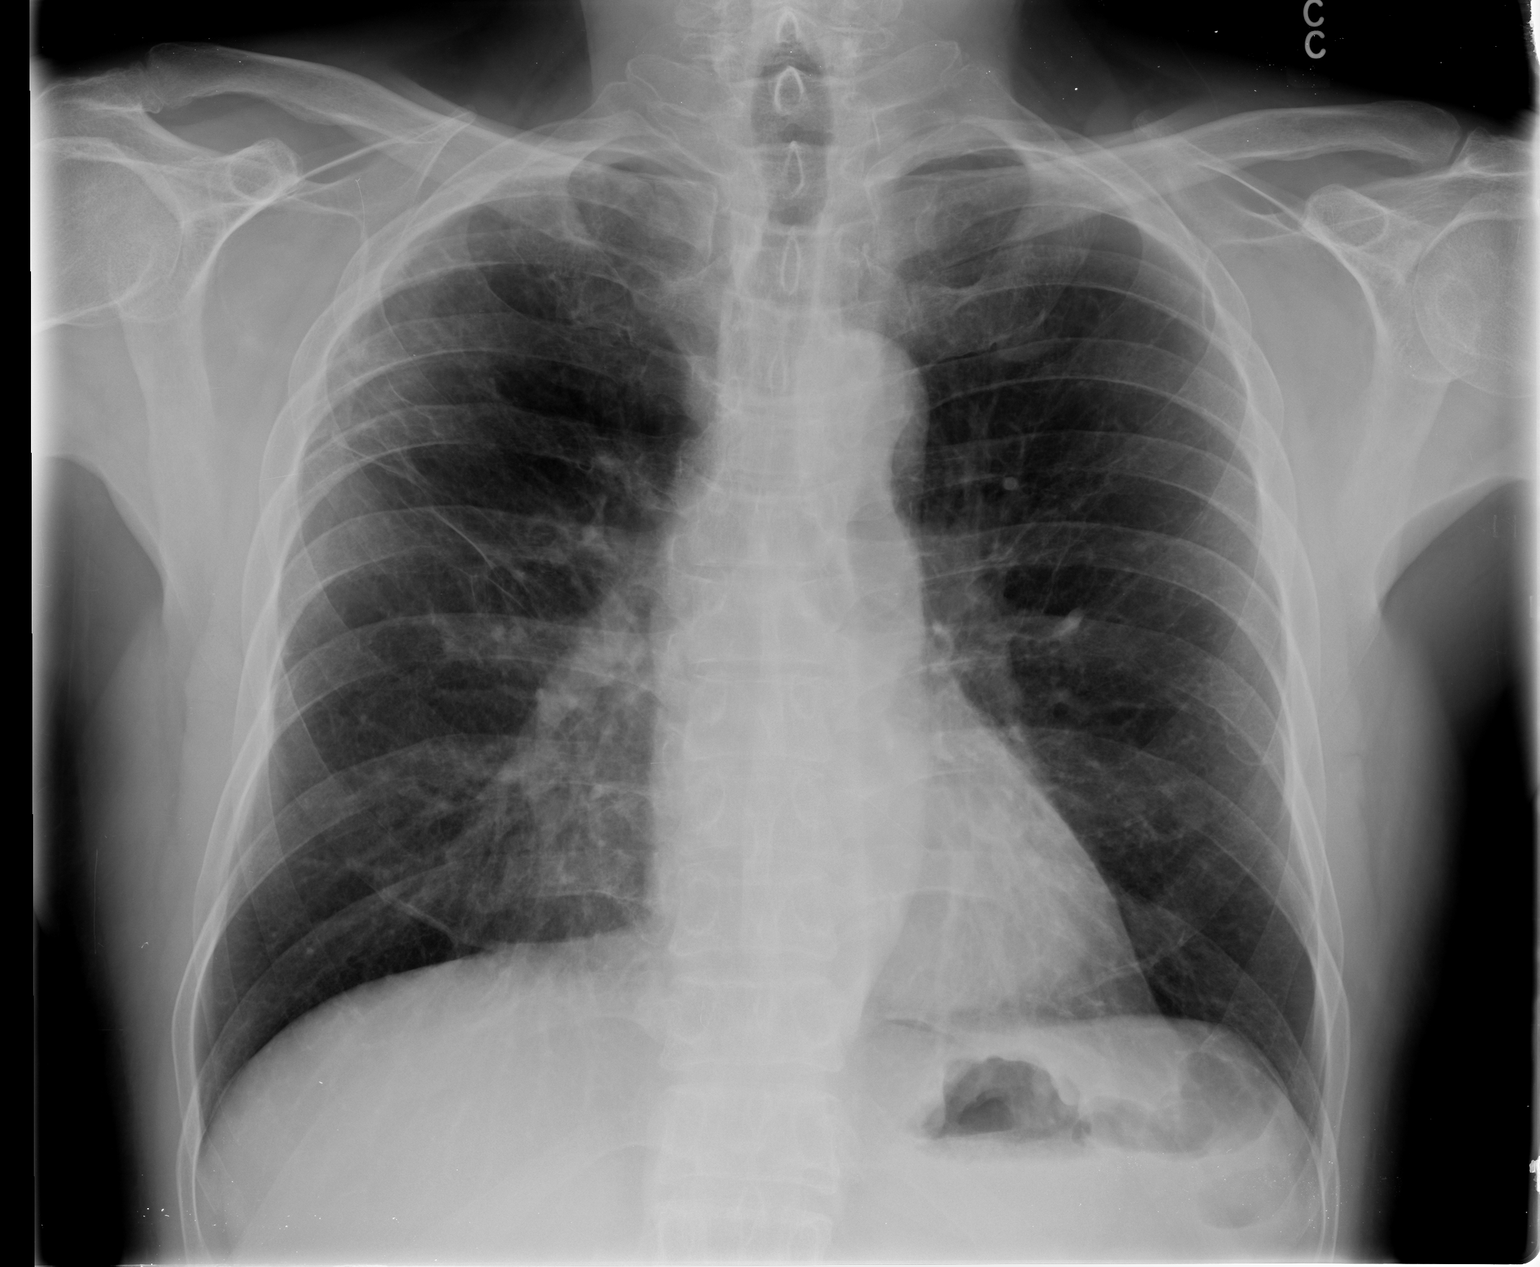

[2 of 2 positions shown; findings below may reference images not displayed]

FINDINGS: The heart size and mediastinal contours are normal.
There is asymmetric scarring peripherally in the right upper lobe.
No edema or confluent airspace opacity is identified.  There is no
pleural effusion.
IMPRESSION: Right upper lobe scarring.  No acute cardiopulmonary process.

## 2012-06-24 ENCOUNTER — Encounter: Payer: Self-pay | Admitting: Surgery

## 2012-06-27 ENCOUNTER — Encounter (INDEPENDENT_AMBULATORY_CARE_PROVIDER_SITE_OTHER): Payer: Medicare Other | Admitting: *Deleted

## 2012-06-27 ENCOUNTER — Ambulatory Visit (INDEPENDENT_AMBULATORY_CARE_PROVIDER_SITE_OTHER): Payer: Medicare Other | Admitting: Surgery

## 2012-06-27 ENCOUNTER — Other Ambulatory Visit (INDEPENDENT_AMBULATORY_CARE_PROVIDER_SITE_OTHER): Payer: Medicare Other | Admitting: *Deleted

## 2012-06-27 ENCOUNTER — Encounter: Payer: Self-pay | Admitting: Surgery

## 2012-06-27 VITALS — BP 120/101 | HR 81 | Resp 18 | Ht 69.0 in | Wt 155.0 lb

## 2012-06-27 DIAGNOSIS — I6529 Occlusion and stenosis of unspecified carotid artery: Secondary | ICD-10-CM

## 2012-06-27 DIAGNOSIS — I739 Peripheral vascular disease, unspecified: Secondary | ICD-10-CM

## 2012-06-27 DIAGNOSIS — Z48812 Encounter for surgical aftercare following surgery on the circulatory system: Secondary | ICD-10-CM

## 2012-06-27 NOTE — Progress Notes (Signed)
[facility] [place] HISTORY AND PHYSICAL   CC: carotid artery stenosis and PVD Halm, Francoise Schaumann, MD  HPI: This is a 71 y.o. male here for f/u for PVD and carotid disease.  He denies any amaurosis fugax, slurred speech, or numbness or paraesthesias in any extremity.    He states that he will have some claudication in his right calf, but if he rests, he can continue walking without difficulty.  He is here today for lower extremity duplex and carotid duplex.  Past Medical History  Diagnosis Date  . Hypertension   . Leg pain     with walking  . Diarrhea 05/05/2010  . Hyperlipidemia   . TIA (transient ischemic attack)   . IBS (irritable bowel syndrome)   . Carotid artery occlusion    Past Surgical History  Procedure Date  . Carotid endarterectomy 10/03/2009    left    No Known Allergies  Current Outpatient Prescriptions  Medication Sig Dispense Refill  . amLODipine-benazepril (LOTREL) 5-10 MG per capsule Take 1 capsule by mouth daily.        Marland Kitchen aspirin 81 MG tablet Take 81 mg by mouth daily.        Marland Kitchen atorvastatin (LIPITOR) 40 MG tablet       . enalapril-hydrochlorothiazide (VASERETIC) 10-25 MG per tablet Take 1 tablet by mouth daily.        . pravastatin (PRAVACHOL) 80 MG tablet Take 80 mg by mouth daily.        . cilostazol (PLETAL) 100 MG tablet Take 1 tablet (100 mg total) by mouth 2 (two) times daily before a meal.  60 tablet  11  . clopidogrel (PLAVIX) 75 MG tablet Take 75 mg by mouth daily.        . simvastatin (ZOCOR) 40 MG tablet Take 40 mg by mouth at bedtime.          Family History  Problem Relation Age of Onset  . Heart disease Father     History   Social History  . Marital Status: Married    Spouse Name: N/A    Number of Children: N/A  . Years of Education: N/A   Occupational History  . Not on file.   Social History Main Topics  . Smoking status: Current Every Day Smoker -- 1.5 packs/day    Types: Cigarettes  . Smokeless tobacco:  Not on file  . Alcohol Use: No  . Drug Use:   . Sexually Active:    Other Topics Concern  . Not on file   Social History Narrative  . No narrative on file     ROS: [x]  Positive   [ ]  Negative   [ ]  All sytems reviewed and are negative  Cardiac: [] chest pain; [] chest pressure; [] palpitations; [] SOB lying flat; [] DOE; Vascular:  [x] pain in right leg with walking; [] pain in legs when lying flat; [] Hx of DVT; [] Hx phlebitis; [] swelling in legs; [] varicose veins Pulmonary: [] productive cough; [] asthma; [] wheezing Neurologic:  [] Hx CVA;  [] weakness in arms or legs; [] numbness in arms or legs; [] difficulty in speaking or slurred speech; [] temporary loss of vision in one eye; [] dizziness Hematologic:  [] bleeding problems; [] clots easily GI:  [] vomiting blood; []  blood in stool; [] PUD GU: []  Dysuria; [] hematuria; [] ESRD Psychiatric:  [] Hx major depression Integumentary:  [] rashes; [] ulcers Constitutional:  [] fever; [] chills    PHYSICAL EXAMINATION:  Filed Vitals:   06/27/12 1611  BP: 120/101  Pulse: 81  Resp:    Body mass index is  22.89 kg/(m^2).  General:  WDWN in NAD Gait: Normal HENT: WNL; well healed left CEA scar. Eyes: PERRL Pulmonary: normal non-labored breathing , without Rales, rhonchi,  wheezing Cardiac: RRR, without  Murmurs, rubs or gallops; No carotid bruits Abdomen: soft, NT, no masses Skin: no rashes, ulcers noted Vascular Exam/Pulses: pedal pulses are non palpable bilaterally.  Bilateral feet are cool to touch. Extremities without ischemic changes, no Gangrene , no cellulitis; no open wounds;  Musculoskeletal: no muscle wasting or atrophy  Neurologic: A&O X 3; Appropriate Affect ; SENSATION: normal; MOTOR FUNCTION:  moving all extremities equally. Speech is fluent/normal  Non-Invasive Vascular Imaging: Carotid duplex 06/27/12: 1.  Evidence of 1-39% right ICA stenosis 2. Patent left CEA with evidence of minimal plaque formation 3. Bilateral intimal medial  thickening is observed in the CCA 4. Left ECA stenosis is present 5. Bilateral vertebral artery is antegrade  ABI 06/27/12: Right 0.57 Left 0.62  LE duplex:  Right: > 50% CFA > 50% PFA >50% P SFA Occluded MSA=FA >50% popliteal  Left: Occluded M SFA  ASSESSMENT: 71 y.o. male with PVD and carotid disease.   He continues to be asymptomatic from his carotid disease with minimal stenosis.  PLAN: will obtain carotid duplex in one year.  Pt will contact us sooner or go to the ER if he begins to be symptomatic. Will continue to observe his lower extremities.  He is not having any rest pain nor is he having inability to ambulate.  We will obtain ABI's in one year.  If his symptoms worsen or he gets ulcers on his feet that will not heal, he is to contact us.  Doreatha Massed, PA-C [facility] [phone]  Clinic MD: Seen and examined in conjunction with Dr. Myra Gianotti.     I agree with the above. The patient was seen and examined. He has no neurologic symptoms. His carotid ultrasound reveals a widely patent bilateral carotid arteries. I discussed the duplex ultrasound findings of his lower extremity. He has bilateral superficial femoral artery occlusion. He did try cilostazol however it received no benefit from this and therefore discontinued. His symptoms are stable and manageable at this time. He has no ulceration or rest pain. He will followup in one year for his carotid and lower extremity disease. If he develops an ulcer or worsening leg pain he will contact me sooner.  Durene Cal

## 2012-08-23 ENCOUNTER — Other Ambulatory Visit: Payer: Self-pay | Admitting: *Deleted

## 2012-08-23 DIAGNOSIS — I739 Peripheral vascular disease, unspecified: Secondary | ICD-10-CM

## 2012-08-23 DIAGNOSIS — I6529 Occlusion and stenosis of unspecified carotid artery: Secondary | ICD-10-CM

## 2013-06-26 ENCOUNTER — Encounter (HOSPITAL_COMMUNITY): Payer: Medicare Other

## 2013-06-26 ENCOUNTER — Other Ambulatory Visit (HOSPITAL_COMMUNITY): Payer: Medicare Other

## 2013-06-26 ENCOUNTER — Ambulatory Visit: Payer: Medicare Other | Admitting: Family

## 2013-07-03 ENCOUNTER — Encounter (HOSPITAL_COMMUNITY): Payer: Self-pay | Admitting: Pharmacy Technician

## 2013-07-04 ENCOUNTER — Encounter (HOSPITAL_COMMUNITY): Payer: Self-pay

## 2013-07-04 ENCOUNTER — Other Ambulatory Visit: Payer: Self-pay

## 2013-07-04 ENCOUNTER — Encounter (HOSPITAL_COMMUNITY)
Admission: RE | Admit: 2013-07-04 | Discharge: 2013-07-04 | Disposition: A | Discharge status: Home / Self Care | Payer: Medicare Other | Source: Ambulatory Visit | Attending: Ophthalmology | Admitting: Ophthalmology

## 2013-07-04 DIAGNOSIS — Z01812 Encounter for preprocedural laboratory examination: Secondary | ICD-10-CM | POA: Insufficient documentation

## 2013-07-04 DIAGNOSIS — Z01818 Encounter for other preprocedural examination: Secondary | ICD-10-CM | POA: Insufficient documentation

## 2013-07-04 DIAGNOSIS — Z01810 Encounter for preprocedural cardiovascular examination: Secondary | ICD-10-CM | POA: Insufficient documentation

## 2013-07-04 LAB — BASIC METABOLIC PANEL
BUN: 27 mg/dL — ABNORMAL HIGH (ref 6–23)
Calcium: 10 mg/dL (ref 8.4–10.5)
GFR calc Af Amer: 32 mL/min — ABNORMAL LOW (ref >90)
GFR calc non Af Amer: 28 mL/min — ABNORMAL LOW (ref >90)
Potassium: 4.1 mEq/L (ref 3.5–5.1)
Sodium: 139 mEq/L (ref 135–145)

## 2013-07-04 LAB — HEMOGLOBIN AND HEMATOCRIT, BLOOD: Hemoglobin: 13.6 g/dL (ref 13.0–17.0)

## 2013-07-04 NOTE — Patient Instructions (Signed)
Your procedure is scheduled on: 07/13/2013  Report to [facility] at  720  AM.  Call this number if you have problems the morning of surgery: [phone]   Do not eat food or drink liquids :After Midnight.      Take these medicines the morning of surgery with A SIP OF WATER: lotrel and vasoretic   Do not wear jewelry, make-up or nail polish.  Do not wear lotions, powders, or perfumes.   Do not shave 48 hours prior to surgery.  Do not bring valuables to the hospital.  Contacts, dentures or bridgework may not be worn into surgery.  Leave suitcase in the car. After surgery it may be brought to your room.  For patients admitted to the hospital, checkout time is 11:00 AM the day of discharge.   Patients discharged the day of surgery will not be allowed to drive home.  :     Please read over the following fact sheets that you were given: Coughing and Deep Breathing, Surgical Site Infection Prevention, Anesthesia Post-op Instructions and Care and Recovery After Surgery    Cataract A cataract is a clouding of the lens of the eye. When a lens becomes cloudy, vision is reduced based on the degree and nature of the clouding. Many cataracts reduce vision to some degree. Some cataracts make people more near-sighted as they develop. Other cataracts increase glare. Cataracts that are ignored and become worse can sometimes look white. The white color can be seen through the pupil. CAUSES   Aging. However, cataracts may occur at any age, even in newborns.   Certain drugs.   Trauma to the eye.   Certain diseases such as diabetes.   Specific eye diseases such as chronic inflammation inside the eye or a sudden attack of a rare form of glaucoma.   Inherited or acquired medical problems.  SYMPTOMS   Gradual, progressive drop in vision in the affected eye.   Severe, rapid visual loss. This most often happens when trauma is the cause.  DIAGNOSIS  To detect a cataract, an eye doctor examines the lens.  Cataracts are best diagnosed with an exam of the eyes with the pupils enlarged (dilated) by drops.  TREATMENT  For an early cataract, vision may improve by using different eyeglasses or stronger lighting. If that does not help your vision, surgery is the only effective treatment. A cataract needs to be surgically removed when vision loss interferes with your everyday activities, such as driving, reading, or watching TV. A cataract may also have to be removed if it prevents examination or treatment of another eye problem. Surgery removes the cloudy lens and usually replaces it with a substitute lens (intraocular lens, IOL).  At a time when both you and your doctor agree, the cataract will be surgically removed. If you have cataracts in both eyes, only one is usually removed at a time. This allows the operated eye to heal and be out of danger from any possible problems after surgery (such as infection or poor wound healing). In rare cases, a cataract may be doing damage to your eye. In these cases, your caregiver may advise surgical removal right away. The vast majority of people who have cataract surgery have better vision afterward. HOME CARE INSTRUCTIONS  If you are not planning surgery, you may be asked to do the following:  Use different eyeglasses.   Use stronger or brighter lighting.   Ask your eye doctor about reducing your medicine dose or changing  medicines if it is thought that a medicine caused your cataract. Changing medicines does not make the cataract go away on its own.   Become familiar with your surroundings. Poor vision can lead to injury. Avoid bumping into things on the affected side. You are at a higher risk for tripping or falling.   Exercise extreme care when driving or operating machinery.   Wear sunglasses if you are sensitive to bright light or experiencing problems with glare.  SEEK IMMEDIATE MEDICAL CARE IF:   You have a worsening or sudden vision loss.   You notice  redness, swelling, or increasing pain in the eye.   You have a fever.  Document Released: 07/27/2005 Document Revised: 07/16/2011 Document Reviewed: 03/20/2011 [facility] Patient Information 2012 [place].PATIENT INSTRUCTIONS POST-ANESTHESIA  IMMEDIATELY FOLLOWING SURGERY:  Do not drive or operate machinery for the first twenty four hours after surgery.  Do not make any important decisions for twenty four hours after surgery or while taking narcotic pain medications or sedatives.  If you develop intractable nausea and vomiting or a severe headache please notify your doctor immediately.  FOLLOW-UP:  Please make an appointment with your surgeon as instructed. You do not need to follow up with anesthesia unless specifically instructed to do so.  WOUND CARE INSTRUCTIONS (if applicable):  Keep a dry clean dressing on the anesthesia/puncture wound site if there is drainage.  Once the wound has quit draining you may leave it open to air.  Generally you should leave the bandage intact for twenty four hours unless there is drainage.  If the epidural site drains for more than 36-48 hours please call the anesthesia department.  QUESTIONS?:  Please feel free to call your physician or the hospital operator if you have any questions, and they will be happy to assist you.

## 2013-07-12 MED ORDER — LIDOCAINE HCL (PF) 1 % IJ SOLN
INTRAMUSCULAR | Status: AC
  Filled 2013-07-12: qty 2

## 2013-07-12 MED ORDER — NEOMYCIN-POLYMYXIN-DEXAMETH 3.5-10000-0.1 OP SUSP
OPHTHALMIC | Status: AC
  Filled 2013-07-12: qty 5

## 2013-07-12 MED ORDER — CYCLOPENTOLATE HCL 1 % OP SOLN
OPHTHALMIC | Status: AC
  Filled 2013-07-12: qty 2

## 2013-07-12 MED ORDER — TETRACAINE HCL 0.5 % OP SOLN
OPHTHALMIC | Status: AC
  Filled 2013-07-12: qty 2

## 2013-07-12 MED ORDER — LIDOCAINE HCL 3.5 % OP GEL
OPHTHALMIC | Status: AC
  Filled 2013-07-12: qty 1

## 2013-07-13 ENCOUNTER — Encounter (HOSPITAL_COMMUNITY): Payer: Medicare Other | Admitting: Anesthesiology

## 2013-07-13 ENCOUNTER — Encounter (HOSPITAL_COMMUNITY)
Admission: RE | Disposition: A | Discharge status: Home / Self Care | Payer: Self-pay | Source: Ambulatory Visit | Attending: Ophthalmology

## 2013-07-13 ENCOUNTER — Ambulatory Visit (HOSPITAL_COMMUNITY)
Admission: RE | Admit: 2013-07-13 | Discharge: 2013-07-13 | Disposition: A | Discharge status: Home / Self Care | Payer: Medicare Other | Source: Ambulatory Visit | Attending: Ophthalmology | Admitting: Ophthalmology

## 2013-07-13 ENCOUNTER — Ambulatory Visit (HOSPITAL_COMMUNITY): Payer: Medicare Other | Admitting: Anesthesiology

## 2013-07-13 ENCOUNTER — Encounter (HOSPITAL_COMMUNITY): Payer: Self-pay | Admitting: *Deleted

## 2013-07-13 DIAGNOSIS — I10 Essential (primary) hypertension: Secondary | ICD-10-CM | POA: Insufficient documentation

## 2013-07-13 DIAGNOSIS — H2510 Age-related nuclear cataract, unspecified eye: Secondary | ICD-10-CM | POA: Insufficient documentation

## 2013-07-13 HISTORY — PX: CATARACT EXTRACTION W/PHACO: SHX586

## 2013-07-13 SURGERY — PHACOEMULSIFICATION, CATARACT, WITH IOL INSERTION
Anesthesia: Monitor Anesthesia Care | Site: Eye | Laterality: Right

## 2013-07-13 MED ORDER — FENTANYL CITRATE 0.05 MG/ML IJ SOLN: 25.0000 ug | INTRAMUSCULAR | Status: DC | PRN

## 2013-07-13 MED ORDER — ONDANSETRON HCL 4 MG/2ML IJ SOLN: 4.0000 mg | Freq: Once | INTRAMUSCULAR | Status: DC | PRN

## 2013-07-13 MED ORDER — LIDOCAINE HCL (PF) 1 % IJ SOLN
INTRAMUSCULAR | Status: DC | PRN
  Administered 2013-07-13: .8 mL

## 2013-07-13 MED ORDER — CYCLOPENTOLATE-PHENYLEPHRINE 0.2-1 % OP SOLN
1.0000 [drp] | OPHTHALMIC | Status: AC
  Administered 2013-07-13 (×3): 1 [drp] via OPHTHALMIC

## 2013-07-13 MED ORDER — BSS IO SOLN
INTRAOCULAR | Status: DC | PRN
  Administered 2013-07-13: 15 mL via INTRAOCULAR

## 2013-07-13 MED ORDER — FENTANYL CITRATE 0.05 MG/ML IJ SOLN
25.0000 ug | INTRAMUSCULAR | Status: AC
  Administered 2013-07-13: 25 ug via INTRAVENOUS

## 2013-07-13 MED ORDER — EPINEPHRINE HCL 1 MG/ML IJ SOLN
INTRAMUSCULAR | Status: AC
  Filled 2013-07-13: qty 1

## 2013-07-13 MED ORDER — MIDAZOLAM HCL 2 MG/2ML IJ SOLN
1.0000 mg | INTRAMUSCULAR | Status: DC | PRN
  Administered 2013-07-13: 2 mg via INTRAVENOUS

## 2013-07-13 MED ORDER — POVIDONE-IODINE 5 % OP SOLN
OPHTHALMIC | Status: DC | PRN
  Administered 2013-07-13: 1 via OPHTHALMIC

## 2013-07-13 MED ORDER — MIDAZOLAM HCL 2 MG/2ML IJ SOLN
INTRAMUSCULAR | Status: AC
  Filled 2013-07-13: qty 2

## 2013-07-13 MED ORDER — LIDOCAINE HCL 3.5 % OP GEL
1.0000 "application " | Freq: Once | OPHTHALMIC | Status: AC
  Administered 2013-07-13: 1 via OPHTHALMIC

## 2013-07-13 MED ORDER — PHENYLEPHRINE HCL 2.5 % OP SOLN
1.0000 [drp] | OPHTHALMIC | Status: AC
  Administered 2013-07-13 (×3): 1 [drp] via OPHTHALMIC

## 2013-07-13 MED ORDER — EPINEPHRINE HCL 1 MG/ML IJ SOLN
INTRAOCULAR | Status: DC | PRN
  Administered 2013-07-13: 09:00:00

## 2013-07-13 MED ORDER — NEOMYCIN-POLYMYXIN-DEXAMETH 3.5-10000-0.1 OP SUSP
OPHTHALMIC | Status: DC | PRN
  Administered 2013-07-13: 2 [drp] via OPHTHALMIC

## 2013-07-13 MED ORDER — PROVISC 10 MG/ML IO SOLN
INTRAOCULAR | Status: DC | PRN
  Administered 2013-07-13: 0.85 mL via INTRAOCULAR

## 2013-07-13 MED ORDER — LACTATED RINGERS IV SOLN
INTRAVENOUS | Status: DC
  Administered 2013-07-13: 1000 mL via INTRAVENOUS

## 2013-07-13 MED ORDER — FENTANYL CITRATE 0.05 MG/ML IJ SOLN
INTRAMUSCULAR | Status: AC
  Filled 2013-07-13: qty 2

## 2013-07-13 MED ORDER — TETRACAINE HCL 0.5 % OP SOLN
1.0000 [drp] | OPHTHALMIC | Status: AC
  Administered 2013-07-13 (×3): 1 [drp] via OPHTHALMIC

## 2013-07-13 SURGICAL SUPPLY — 32 items

## 2013-07-13 NOTE — Op Note (Signed)
Date of Admission: 07/13/2013  Date of Surgery: 07/13/2013  Pre-Op Dx: Cataract  Right  Eye  Post-Op Dx: Cataract  Right  Eye,  Dx Code 366.16  Surgeon: Gemma Payor, M.D.  Assistants: None  Anesthesia: Topical with MAC  Indications: Painless, progressive loss of vision with compromise of daily activities.  Surgery: Cataract Extraction with Intraocular lens Implant Right Eye  Discription: The patient had dilating drops and viscous lidocaine placed into the right eye in the pre-op holding area. After transfer to the operating room, a time out was performed. The patient was then prepped and draped. Beginning with a 75 degree blade a paracentesis port was made at the surgeon's 2 o'clock position. The anterior chamber was then filled with 1% non-preserved lidocaine. This was followed by filling the anterior chamber with Provisc.  A 2.4mm keratome blade was used to make a clear corneal incision at the temporal limbus.  A bent cystatome needle was used to create a continuous tear capsulotomy. Hydrodissection was performed with balanced salt solution on a Fine canula. The lens nucleus was then removed using the phacoemulsification handpiece. Residual cortex was removed with the I&A handpiece. The anterior chamber and capsular bag were refilled with Provisc. A posterior chamber intraocular lens was placed into the capsular bag with it's injector. The implant was positioned with the Kuglan hook. The Provisc was then removed from the anterior chamber and capsular bag with the I&A handpiece. Stromal hydration of the main incision and paracentesis port was performed with BSS on a Fine canula. The wounds were tested for leak which was negative. The patient tolerated the procedure well. There were no operative complications. The patient was then transferred to the recovery room in stable condition.  Complications: None  Specimen: None  EBL: None  Prosthetic device: B&L enVista, MX60, power 29.0D, SN  [id number].

## 2013-07-13 NOTE — H&P (Signed)
I have reviewed the H&P, the patient was re-examined, and I have identified no interval changes in medical condition and plan of care since the history and physical of record  

## 2013-07-13 NOTE — Anesthesia Postprocedure Evaluation (Signed)
  Anesthesia Post-op Note  Patient: Eugene Mack  Procedure(s) Performed: Procedure(s) with comments: CATARACT EXTRACTION PHACO AND INTRAOCULAR LENS PLACEMENT (IOC) (Right) - CDE 27.29  Patient Location: Short Stay  Anesthesia Type:MAC  Level of Consciousness: awake, alert , oriented and patient cooperative  Airway and Oxygen Therapy: Patient Spontanous Breathing  Post-op Pain: none  Post-op Assessment: Post-op Vital signs reviewed, Patient's Cardiovascular Status Stable, Patent Airway and Pain level controlled  Post-op Vital Signs: Reviewed and stable  Complications: No apparent anesthesia complications

## 2013-07-13 NOTE — Transfer of Care (Signed)
Immediate Anesthesia Transfer of Care Note  Patient: Eugene Mack  Procedure(s) Performed: Procedure(s) with comments: CATARACT EXTRACTION PHACO AND INTRAOCULAR LENS PLACEMENT (IOC) (Right) - CDE 27.29  Patient Location: Short Stay  Anesthesia Type:MAC  Level of Consciousness: awake, alert , oriented and patient cooperative  Airway & Oxygen Therapy: Patient Spontanous Breathing  Post-op Assessment: Report given to PACU RN, Post -op Vital signs reviewed and stable and Patient moving all extremities  Post vital signs: Reviewed and stable  Complications: No apparent anesthesia complications

## 2013-07-13 NOTE — Anesthesia Preprocedure Evaluation (Signed)
Anesthesia Evaluation  Patient identified by MRN, date of birth, ID band Patient awake    Reviewed: Allergy & Precautions, H&P , NPO status , Patient's Chart, lab work & pertinent test results  Airway Mallampati: II TM Distance: >3 FB     Dental  (+) Edentulous Upper and Edentulous Lower   Pulmonary Current Smoker,  breath sounds clear to auscultation        Cardiovascular hypertension, Pt. on medications + Peripheral Vascular Disease (carotid dx and led claudication.) Rhythm:Regular Rate:Normal     Neuro/Psych TIA   GI/Hepatic   Endo/Other    Renal/GU Renal InsufficiencyRenal disease     Musculoskeletal   Abdominal   Peds  Hematology  (+) anemia ,   Anesthesia Other Findings   Reproductive/Obstetrics                           Anesthesia Physical Anesthesia Plan  ASA: III  Anesthesia Plan: MAC   Post-op Pain Management:    Induction: Intravenous  Airway Management Planned: Nasal Cannula  Additional Equipment:   Intra-op Plan:   Post-operative Plan:   Informed Consent: I have reviewed the patients History and Physical, chart, labs and discussed the procedure including the risks, benefits and alternatives for the proposed anesthesia with the patient or authorized representative who has indicated his/her understanding and acceptance.     Plan Discussed with:   Anesthesia Plan Comments:         Anesthesia Quick Evaluation

## 2013-07-17 ENCOUNTER — Encounter (HOSPITAL_COMMUNITY): Payer: Self-pay | Admitting: Ophthalmology

## 2013-07-26 ENCOUNTER — Encounter (HOSPITAL_COMMUNITY): Payer: Self-pay | Admitting: Pharmacy Technician

## 2013-07-27 ENCOUNTER — Encounter (HOSPITAL_COMMUNITY)
Admission: RE | Admit: 2013-07-27 | Discharge: 2013-07-27 | Disposition: A | Discharge status: Home / Self Care | Payer: Medicare Other | Source: Ambulatory Visit | Attending: Ophthalmology | Admitting: Ophthalmology

## 2013-07-27 ENCOUNTER — Encounter (HOSPITAL_COMMUNITY): Payer: Self-pay

## 2013-07-31 ENCOUNTER — Ambulatory Visit (HOSPITAL_COMMUNITY)
Admission: RE | Admit: 2013-07-31 | Discharge: 2013-07-31 | Disposition: A | Discharge status: Home / Self Care | Payer: Medicare Other | Source: Ambulatory Visit | Attending: Ophthalmology | Admitting: Ophthalmology

## 2013-07-31 ENCOUNTER — Ambulatory Visit (HOSPITAL_COMMUNITY): Payer: Medicare Other | Admitting: Anesthesiology

## 2013-07-31 ENCOUNTER — Encounter (HOSPITAL_COMMUNITY): Payer: Self-pay | Admitting: *Deleted

## 2013-07-31 ENCOUNTER — Encounter (HOSPITAL_COMMUNITY): Payer: Medicare Other | Admitting: Anesthesiology

## 2013-07-31 ENCOUNTER — Encounter (HOSPITAL_COMMUNITY)
Admission: RE | Disposition: A | Discharge status: Home / Self Care | Payer: Self-pay | Source: Ambulatory Visit | Attending: Ophthalmology

## 2013-07-31 DIAGNOSIS — I10 Essential (primary) hypertension: Secondary | ICD-10-CM | POA: Insufficient documentation

## 2013-07-31 DIAGNOSIS — H2510 Age-related nuclear cataract, unspecified eye: Secondary | ICD-10-CM | POA: Insufficient documentation

## 2013-07-31 HISTORY — PX: CATARACT EXTRACTION W/PHACO: SHX586

## 2013-07-31 SURGERY — PHACOEMULSIFICATION, CATARACT, WITH IOL INSERTION
Anesthesia: Monitor Anesthesia Care | Site: Eye | Laterality: Left

## 2013-07-31 MED ORDER — LIDOCAINE HCL (PF) 1 % IJ SOLN
INTRAMUSCULAR | Status: DC | PRN
  Administered 2013-07-31: .4 mL

## 2013-07-31 MED ORDER — FENTANYL CITRATE 0.05 MG/ML IJ SOLN
25.0000 ug | INTRAMUSCULAR | Status: AC
  Administered 2013-07-31 (×2): 25 ug via INTRAVENOUS

## 2013-07-31 MED ORDER — POVIDONE-IODINE 5 % OP SOLN
OPHTHALMIC | Status: DC | PRN
  Administered 2013-07-31: 1 via OPHTHALMIC

## 2013-07-31 MED ORDER — PHENYLEPHRINE HCL 2.5 % OP SOLN
1.0000 [drp] | OPHTHALMIC | Status: AC
  Administered 2013-07-31 (×3): 1 [drp] via OPHTHALMIC

## 2013-07-31 MED ORDER — TETRACAINE HCL 0.5 % OP SOLN
1.0000 [drp] | OPHTHALMIC | Status: AC
  Administered 2013-07-31 (×3): 1 [drp] via OPHTHALMIC

## 2013-07-31 MED ORDER — EPINEPHRINE HCL 1 MG/ML IJ SOLN
INTRAOCULAR | Status: DC | PRN
  Administered 2013-07-31: 14:00:00

## 2013-07-31 MED ORDER — LIDOCAINE HCL 3.5 % OP GEL
1.0000 "application " | Freq: Once | OPHTHALMIC | Status: AC
  Administered 2013-07-31: 1 via OPHTHALMIC

## 2013-07-31 MED ORDER — MIDAZOLAM HCL 2 MG/2ML IJ SOLN
INTRAMUSCULAR | Status: AC
  Filled 2013-07-31: qty 2

## 2013-07-31 MED ORDER — NA HYALUR & NA CHOND-NA HYALUR 0.55-0.5 ML IO KIT
PACK | INTRAOCULAR | Status: DC | PRN
  Administered 2013-07-31: 1 via OPHTHALMIC

## 2013-07-31 MED ORDER — FENTANYL CITRATE 0.05 MG/ML IJ SOLN: 25.0000 ug | INTRAMUSCULAR | Status: DC | PRN

## 2013-07-31 MED ORDER — FENTANYL CITRATE 0.05 MG/ML IJ SOLN
INTRAMUSCULAR | Status: AC
  Administered 2013-07-31: 25 ug via INTRAVENOUS
  Filled 2013-07-31: qty 2

## 2013-07-31 MED ORDER — CYCLOPENTOLATE-PHENYLEPHRINE 0.2-1 % OP SOLN
1.0000 [drp] | OPHTHALMIC | Status: AC
  Administered 2013-07-31 (×3): 1 [drp] via OPHTHALMIC

## 2013-07-31 MED ORDER — MIDAZOLAM HCL 2 MG/2ML IJ SOLN
1.0000 mg | INTRAMUSCULAR | Status: DC | PRN
  Administered 2013-07-31: 2 mg via INTRAVENOUS

## 2013-07-31 MED ORDER — KETOROLAC TROMETHAMINE 0.5 % OP SOLN: 1.0000 [drp] | OPHTHALMIC | Status: DC

## 2013-07-31 MED ORDER — LACTATED RINGERS IV SOLN
INTRAVENOUS | Status: DC
  Administered 2013-07-31: 13:00:00 via INTRAVENOUS

## 2013-07-31 MED ORDER — BSS IO SOLN
INTRAOCULAR | Status: DC | PRN
  Administered 2013-07-31: 15 mL via INTRAOCULAR

## 2013-07-31 MED ORDER — ONDANSETRON HCL 4 MG/2ML IJ SOLN: 4.0000 mg | Freq: Once | INTRAMUSCULAR | Status: DC | PRN

## 2013-07-31 SURGICAL SUPPLY — 32 items

## 2013-07-31 NOTE — H&P (Signed)
I have reviewed the H&P, the patient was re-examined, and I have identified no interval changes in medical condition and plan of care since the history and physical of record  

## 2013-07-31 NOTE — Anesthesia Procedure Notes (Signed)
Procedure Name: MAC Date/Time: 07/31/2013 1:51 PM Performed by: Franco Nones Pre-anesthesia Checklist: Patient identified, Emergency Drugs available, Suction available, Timeout performed and Patient being monitored Patient Re-evaluated:Patient Re-evaluated prior to inductionOxygen Delivery Method: Nasal Cannula

## 2013-07-31 NOTE — Anesthesia Preprocedure Evaluation (Signed)
Anesthesia Evaluation  Patient identified by MRN, date of birth, ID band Patient awake    Reviewed: Allergy & Precautions, H&P , NPO status , Patient's Chart, lab work & pertinent test results  Airway Mallampati: II TM Distance: >3 FB     Dental  (+) Edentulous Upper and Edentulous Lower   Pulmonary Current Smoker,  breath sounds clear to auscultation        Cardiovascular hypertension, Pt. on medications + Peripheral Vascular Disease (carotid dx and led claudication.) Rhythm:Regular Rate:Normal     Neuro/Psych TIA   GI/Hepatic   Endo/Other    Renal/GU Renal InsufficiencyRenal disease     Musculoskeletal   Abdominal   Peds  Hematology  (+) anemia ,   Anesthesia Other Findings   Reproductive/Obstetrics                           Anesthesia Physical Anesthesia Plan  ASA: III  Anesthesia Plan: MAC   Post-op Pain Management:    Induction: Intravenous  Airway Management Planned: Nasal Cannula  Additional Equipment:   Intra-op Plan:   Post-operative Plan:   Informed Consent: I have reviewed the patients History and Physical, chart, labs and discussed the procedure including the risks, benefits and alternatives for the proposed anesthesia with the patient or authorized representative who has indicated his/her understanding and acceptance.     Plan Discussed with:   Anesthesia Plan Comments:         Anesthesia Quick Evaluation  

## 2013-07-31 NOTE — Transfer of Care (Signed)
Immediate Anesthesia Transfer of Care Note  Patient: Eugene Mack  Procedure(s) Performed: Procedure(s) (LRB): CATARACT EXTRACTION PHACO AND INTRAOCULAR LENS PLACEMENT (IOC) (Left)  Patient Location: Shortstay  Anesthesia Type: MAC  Level of Consciousness: awake  Airway & Oxygen Therapy: Patient Spontanous Breathing   Post-op Assessment: Report given to PACU RN, Post -op Vital signs reviewed and stable and Patient moving all extremities  Post vital signs: Reviewed and stable  Complications: No apparent anesthesia complications

## 2013-07-31 NOTE — Anesthesia Postprocedure Evaluation (Signed)
  Anesthesia Post-op Note  Patient: Eugene Mack  Procedure(s) Performed: Procedure(s) (LRB): CATARACT EXTRACTION PHACO AND INTRAOCULAR LENS PLACEMENT (IOC) (Left)  Patient Location:  Short Stay  Anesthesia Type: MAC  Level of Consciousness: awake  Airway and Oxygen Therapy: Patient Spontanous Breathing  Post-op Pain: none  Post-op Assessment: Post-op Vital signs reviewed, Patient's Cardiovascular Status Stable, Respiratory Function Stable, Patent Airway, No signs of Nausea or vomiting and Pain level controlled  Post-op Vital Signs: Reviewed and stable  Complications: No apparent anesthesia complications

## 2013-07-31 NOTE — Op Note (Signed)
Date of Admission: 07/31/2013  Date of Surgery: 07/31/2013  Pre-Op Dx: Cataract  Left  Eye  Post-Op Dx: Cataract  Left  Eye,  Dx Code 366.16  Surgeon: Gemma Payor, M.D.  Assistants: None  Anesthesia: Topical with MAC  Indications: Painless, progressive loss of vision with compromise of daily activities.  Surgery: Cataract Extraction with Intraocular lens Implant Left Eye  Discription: The patient had dilating drops and viscous lidocaine placed into the left eye in the pre-op holding area. After transfer to the operating room, a time out was performed. The patient was then prepped and draped. Beginning with a 75 degree blade a paracentesis port was made at the surgeon's 2 o'clock position. The anterior chamber was then filled with 1% non-preserved lidocaine. This was followed by filling the anterior chamber with Provisc. A 2.85mm keratome blade was used to make a clear corneal incision at the temporal limbus. A bent cystatome needle was used to create a continuous tear capsulotomy. Hydrodissection was performed with balanced salt solution on a Fine canula. The lens nucleus was then removed using the phacoemulsification handpiece. Residual cortex was removed with the I&A handpiece. The anterior chamber and capsular bag were refilled with Provisc. A posterior chamber intraocular lens was placed into the capsular bag with it's injector. The implant was positioned with the Kuglan hook. The Provisc was then removed from the anterior chamber and capsular bag with the I&A handpiece. Stromal hydration of the main incision and paracentesis port was performed with BSS on a Fine canula. The wounds were tested for leak which was negative. The patient tolerated the procedure well. There were no operative complications. The patient was then transferred to the recovery room in stable condition.  Complications: None  Specimen: None  EBL: None  Prosthetic device: B&L enVista, MX60, power 30.0D, SN  [id number].

## 2013-08-01 ENCOUNTER — Encounter (HOSPITAL_COMMUNITY): Payer: Self-pay | Admitting: Ophthalmology

## 2013-08-01 NOTE — Addendum Note (Signed)
Addendum created 08/01/13 0845 by Fabienne Bruns, RN   Modules edited: Anesthesia Responsible Staff

## 2015-10-25 DIAGNOSIS — Z0000 Encounter for general adult medical examination without abnormal findings: Secondary | ICD-10-CM | POA: Diagnosis not present

## 2015-10-25 DIAGNOSIS — I10 Essential (primary) hypertension: Secondary | ICD-10-CM | POA: Diagnosis not present

## 2015-10-25 DIAGNOSIS — N183 Chronic kidney disease, stage 3 (moderate): Secondary | ICD-10-CM | POA: Diagnosis not present

## 2015-10-25 DIAGNOSIS — I63411 Cerebral infarction due to embolism of right middle cerebral artery: Secondary | ICD-10-CM | POA: Diagnosis not present

## 2015-10-25 DIAGNOSIS — Z6821 Body mass index (BMI) 21.0-21.9, adult: Secondary | ICD-10-CM | POA: Diagnosis not present

## 2016-01-24 DIAGNOSIS — Z1389 Encounter for screening for other disorder: Secondary | ICD-10-CM | POA: Diagnosis not present

## 2016-01-24 DIAGNOSIS — I69898 Other sequelae of other cerebrovascular disease: Secondary | ICD-10-CM | POA: Diagnosis not present

## 2016-01-24 DIAGNOSIS — Z6821 Body mass index (BMI) 21.0-21.9, adult: Secondary | ICD-10-CM | POA: Diagnosis not present

## 2016-01-24 DIAGNOSIS — N183 Chronic kidney disease, stage 3 (moderate): Secondary | ICD-10-CM | POA: Diagnosis not present

## 2016-01-24 DIAGNOSIS — Z0000 Encounter for general adult medical examination without abnormal findings: Secondary | ICD-10-CM | POA: Diagnosis not present

## 2016-01-24 DIAGNOSIS — I10 Essential (primary) hypertension: Secondary | ICD-10-CM | POA: Diagnosis not present

## 2016-01-24 DIAGNOSIS — E784 Other hyperlipidemia: Secondary | ICD-10-CM | POA: Diagnosis not present

## 2016-04-14 DIAGNOSIS — E784 Other hyperlipidemia: Secondary | ICD-10-CM | POA: Diagnosis not present

## 2016-04-14 DIAGNOSIS — I10 Essential (primary) hypertension: Secondary | ICD-10-CM | POA: Diagnosis not present

## 2016-04-14 DIAGNOSIS — N183 Chronic kidney disease, stage 3 (moderate): Secondary | ICD-10-CM | POA: Diagnosis not present

## 2016-04-27 DIAGNOSIS — E784 Other hyperlipidemia: Secondary | ICD-10-CM | POA: Diagnosis not present

## 2016-04-27 DIAGNOSIS — Z6820 Body mass index (BMI) 20.0-20.9, adult: Secondary | ICD-10-CM | POA: Diagnosis not present

## 2016-04-27 DIAGNOSIS — I10 Essential (primary) hypertension: Secondary | ICD-10-CM | POA: Diagnosis not present

## 2016-04-27 DIAGNOSIS — N183 Chronic kidney disease, stage 3 (moderate): Secondary | ICD-10-CM | POA: Diagnosis not present

## 2016-04-27 DIAGNOSIS — I69898 Other sequelae of other cerebrovascular disease: Secondary | ICD-10-CM | POA: Diagnosis not present

## 2016-05-26 DIAGNOSIS — E784 Other hyperlipidemia: Secondary | ICD-10-CM | POA: Diagnosis not present

## 2016-05-26 DIAGNOSIS — N183 Chronic kidney disease, stage 3 (moderate): Secondary | ICD-10-CM | POA: Diagnosis not present

## 2016-05-26 DIAGNOSIS — I10 Essential (primary) hypertension: Secondary | ICD-10-CM | POA: Diagnosis not present

## 2016-06-30 DIAGNOSIS — N183 Chronic kidney disease, stage 3 (moderate): Secondary | ICD-10-CM | POA: Diagnosis not present

## 2016-06-30 DIAGNOSIS — I10 Essential (primary) hypertension: Secondary | ICD-10-CM | POA: Diagnosis not present

## 2016-06-30 DIAGNOSIS — E784 Other hyperlipidemia: Secondary | ICD-10-CM | POA: Diagnosis not present

## 2016-07-27 DIAGNOSIS — E784 Other hyperlipidemia: Secondary | ICD-10-CM | POA: Diagnosis not present

## 2016-07-27 DIAGNOSIS — I69898 Other sequelae of other cerebrovascular disease: Secondary | ICD-10-CM | POA: Diagnosis not present

## 2016-07-27 DIAGNOSIS — Z6820 Body mass index (BMI) 20.0-20.9, adult: Secondary | ICD-10-CM | POA: Diagnosis not present

## 2016-07-27 DIAGNOSIS — N183 Chronic kidney disease, stage 3 (moderate): Secondary | ICD-10-CM | POA: Diagnosis not present

## 2016-07-27 DIAGNOSIS — I10 Essential (primary) hypertension: Secondary | ICD-10-CM | POA: Diagnosis not present

## 2016-08-27 DIAGNOSIS — N183 Chronic kidney disease, stage 3 (moderate): Secondary | ICD-10-CM | POA: Diagnosis not present

## 2016-08-27 DIAGNOSIS — I10 Essential (primary) hypertension: Secondary | ICD-10-CM | POA: Diagnosis not present

## 2016-08-27 DIAGNOSIS — E784 Other hyperlipidemia: Secondary | ICD-10-CM | POA: Diagnosis not present

## 2016-09-23 DIAGNOSIS — N183 Chronic kidney disease, stage 3 (moderate): Secondary | ICD-10-CM | POA: Diagnosis not present

## 2016-09-23 DIAGNOSIS — I10 Essential (primary) hypertension: Secondary | ICD-10-CM | POA: Diagnosis not present

## 2016-09-23 DIAGNOSIS — E784 Other hyperlipidemia: Secondary | ICD-10-CM | POA: Diagnosis not present

## 2016-10-27 DIAGNOSIS — Z125 Encounter for screening for malignant neoplasm of prostate: Secondary | ICD-10-CM | POA: Diagnosis not present

## 2016-10-27 DIAGNOSIS — Z6820 Body mass index (BMI) 20.0-20.9, adult: Secondary | ICD-10-CM | POA: Diagnosis not present

## 2016-10-27 DIAGNOSIS — I69898 Other sequelae of other cerebrovascular disease: Secondary | ICD-10-CM | POA: Diagnosis not present

## 2016-10-27 DIAGNOSIS — Z1389 Encounter for screening for other disorder: Secondary | ICD-10-CM | POA: Diagnosis not present

## 2016-10-27 DIAGNOSIS — Z0000 Encounter for general adult medical examination without abnormal findings: Secondary | ICD-10-CM | POA: Diagnosis not present

## 2016-10-27 DIAGNOSIS — N183 Chronic kidney disease, stage 3 (moderate): Secondary | ICD-10-CM | POA: Diagnosis not present

## 2016-10-27 DIAGNOSIS — E784 Other hyperlipidemia: Secondary | ICD-10-CM | POA: Diagnosis not present

## 2016-10-27 DIAGNOSIS — I10 Essential (primary) hypertension: Secondary | ICD-10-CM | POA: Diagnosis not present

## 2016-12-13 ENCOUNTER — Inpatient Hospital Stay (HOSPITAL_COMMUNITY): Payer: Medicare Other

## 2016-12-13 DIAGNOSIS — Z66 Do not resuscitate: Secondary | ICD-10-CM | POA: Diagnosis present

## 2016-12-13 DIAGNOSIS — E785 Hyperlipidemia, unspecified: Secondary | ICD-10-CM | POA: Diagnosis not present

## 2016-12-13 DIAGNOSIS — F17210 Nicotine dependence, cigarettes, uncomplicated: Secondary | ICD-10-CM | POA: Diagnosis present

## 2016-12-13 DIAGNOSIS — E872 Acidosis: Secondary | ICD-10-CM | POA: Diagnosis present

## 2016-12-13 DIAGNOSIS — I6529 Occlusion and stenosis of unspecified carotid artery: Secondary | ICD-10-CM | POA: Diagnosis present

## 2016-12-13 DIAGNOSIS — Z9842 Cataract extraction status, left eye: Secondary | ICD-10-CM | POA: Diagnosis not present

## 2016-12-13 DIAGNOSIS — I2510 Atherosclerotic heart disease of native coronary artery without angina pectoris: Secondary | ICD-10-CM | POA: Diagnosis not present

## 2016-12-13 DIAGNOSIS — Z4682 Encounter for fitting and adjustment of non-vascular catheter: Secondary | ICD-10-CM | POA: Diagnosis not present

## 2016-12-13 DIAGNOSIS — Z8673 Personal history of transient ischemic attack (TIA), and cerebral infarction without residual deficits: Secondary | ICD-10-CM | POA: Diagnosis not present

## 2016-12-13 DIAGNOSIS — M79606 Pain in leg, unspecified: Secondary | ICD-10-CM | POA: Diagnosis not present

## 2016-12-13 DIAGNOSIS — I469 Cardiac arrest, cause unspecified: Secondary | ICD-10-CM | POA: Diagnosis present

## 2016-12-13 DIAGNOSIS — I219 Acute myocardial infarction, unspecified: Secondary | ICD-10-CM | POA: Diagnosis present

## 2016-12-13 DIAGNOSIS — Z961 Presence of intraocular lens: Secondary | ICD-10-CM | POA: Diagnosis present

## 2016-12-13 DIAGNOSIS — Z9841 Cataract extraction status, right eye: Secondary | ICD-10-CM

## 2016-12-13 DIAGNOSIS — I10 Essential (primary) hypertension: Secondary | ICD-10-CM | POA: Diagnosis present

## 2016-12-13 DIAGNOSIS — R197 Diarrhea, unspecified: Secondary | ICD-10-CM | POA: Diagnosis present

## 2016-12-13 DIAGNOSIS — Z515 Encounter for palliative care: Secondary | ICD-10-CM | POA: Diagnosis present

## 2016-12-13 DIAGNOSIS — J9600 Acute respiratory failure, unspecified whether with hypoxia or hypercapnia: Secondary | ICD-10-CM | POA: Diagnosis not present

## 2016-12-13 DIAGNOSIS — I639 Cerebral infarction, unspecified: Secondary | ICD-10-CM | POA: Diagnosis not present

## 2016-12-13 DIAGNOSIS — N179 Acute kidney failure, unspecified: Secondary | ICD-10-CM | POA: Diagnosis not present

## 2016-12-13 DIAGNOSIS — D72829 Elevated white blood cell count, unspecified: Secondary | ICD-10-CM | POA: Diagnosis present

## 2016-12-13 DIAGNOSIS — J9690 Respiratory failure, unspecified, unspecified whether with hypoxia or hypercapnia: Secondary | ICD-10-CM | POA: Diagnosis not present

## 2016-12-13 DIAGNOSIS — E875 Hyperkalemia: Secondary | ICD-10-CM | POA: Diagnosis not present

## 2016-12-13 DIAGNOSIS — K589 Irritable bowel syndrome without diarrhea: Secondary | ICD-10-CM | POA: Diagnosis present

## 2016-12-13 DIAGNOSIS — Z7982 Long term (current) use of aspirin: Secondary | ICD-10-CM | POA: Diagnosis not present

## 2016-12-13 DIAGNOSIS — Z79899 Other long term (current) drug therapy: Secondary | ICD-10-CM | POA: Diagnosis not present

## 2016-12-13 DIAGNOSIS — Z4659 Encounter for fitting and adjustment of other gastrointestinal appliance and device: Secondary | ICD-10-CM

## 2016-12-13 DIAGNOSIS — Z01818 Encounter for other preprocedural examination: Secondary | ICD-10-CM

## 2016-12-13 DIAGNOSIS — J449 Chronic obstructive pulmonary disease, unspecified: Secondary | ICD-10-CM | POA: Diagnosis not present

## 2016-12-13 LAB — PROTIME-INR
INR: 2.14
PROTHROMBIN TIME: 24.3 s — AB (ref 11.4–15.2)

## 2016-12-13 LAB — COMPREHENSIVE METABOLIC PANEL
ALBUMIN: 2.6 g/dL — AB (ref 3.5–5.0)
ALK PHOS: 120 U/L (ref 38–126)
ALT: 117 U/L — ABNORMAL HIGH (ref 17–63)
AST: 230 U/L — AB (ref 15–41)
Anion gap: 16 — ABNORMAL HIGH (ref 5–15)
BILIRUBIN TOTAL: 0.6 mg/dL (ref 0.3–1.2)
BUN: 30 mg/dL — AB (ref 6–20)
CALCIUM: 7.8 mg/dL — AB (ref 8.9–10.3)
CO2: 17 mmol/L — ABNORMAL LOW (ref 22–32)
Chloride: 109 mmol/L (ref 101–111)
Creatinine, Ser: 3.39 mg/dL — ABNORMAL HIGH (ref 0.61–1.24)
GFR calc Af Amer: 19 mL/min — ABNORMAL LOW (ref >60)
GFR, EST NON AFRICAN AMERICAN: 16 mL/min — AB (ref >60)
GLUCOSE: 227 mg/dL — AB (ref 65–99)
POTASSIUM: 5.8 mmol/L — AB (ref 3.5–5.1)
Sodium: 142 mmol/L (ref 135–145)
TOTAL PROTEIN: 6.2 g/dL — AB (ref 6.5–8.1)

## 2016-12-13 LAB — CBC
HEMATOCRIT: 33.1 % — AB (ref 39.0–52.0)
HEMOGLOBIN: 10.4 g/dL — AB (ref 13.0–17.0)
MCH: 25.8 pg — ABNORMAL LOW (ref 26.0–34.0)
MCHC: 31.4 g/dL (ref 30.0–36.0)
MCV: 82.1 fL (ref 78.0–100.0)
Platelets: 169 10*3/uL (ref 150–400)
RBC: 4.03 MIL/uL — ABNORMAL LOW (ref 4.22–5.81)
RDW: 15.7 % — AB (ref 11.5–15.5)
WBC: 25.7 10*3/uL — AB (ref 4.0–10.5)

## 2016-12-13 LAB — LACTIC ACID, PLASMA: Lactic Acid, Venous: 7.3 mmol/L (ref 0.5–1.9)

## 2016-12-13 LAB — POCT I-STAT 3, ART BLOOD GAS (G3+)
ACID-BASE DEFICIT: 10 mmol/L — AB (ref 0.0–2.0)
Bicarbonate: 17.9 mmol/L — ABNORMAL LOW (ref 20.0–28.0)
O2 SAT: 99 %
PCO2 ART: 46.8 mmHg (ref 32.0–48.0)
PH ART: 7.185 — AB (ref 7.350–7.450)
TCO2: 19 mmol/L (ref 0–100)
pO2, Arterial: 178 mmHg — ABNORMAL HIGH (ref 83.0–108.0)

## 2016-12-13 LAB — TYPE AND SCREEN
ABO/RH(D): A POS
ANTIBODY SCREEN: NEGATIVE

## 2016-12-13 LAB — PHOSPHORUS: Phosphorus: 8.4 mg/dL — ABNORMAL HIGH (ref 2.5–4.6)

## 2016-12-13 LAB — TROPONIN I
TROPONIN I: 8.8 ng/mL — AB (ref <0.03)
Troponin I: 0.98 ng/mL (ref <0.03)

## 2016-12-13 LAB — MRSA PCR SCREENING: MRSA by PCR: NEGATIVE

## 2016-12-13 LAB — MAGNESIUM: MAGNESIUM: 2 mg/dL (ref 1.7–2.4)

## 2016-12-13 MED ORDER — FAMOTIDINE 40 MG/5ML PO SUSR
20.0000 mg | Freq: Two times a day (BID) | ORAL | Status: DC
  Administered 2016-12-13: 20 mg

## 2016-12-13 MED ORDER — ASPIRIN 300 MG RE SUPP: 300.0000 mg | RECTAL | Status: DC

## 2016-12-13 MED ORDER — CHLORHEXIDINE GLUCONATE 0.12% ORAL RINSE (MEDLINE KIT): 15.0000 mL | Freq: Two times a day (BID) | OROMUCOSAL | Status: DC

## 2016-12-13 MED ORDER — ORAL CARE MOUTH RINSE
15.0000 mL | OROMUCOSAL | Status: DC
  Administered 2016-12-13: 15 mL via OROMUCOSAL

## 2016-12-13 MED ORDER — ASPIRIN 81 MG PO CHEW: 324.0000 mg | CHEWABLE_TABLET | ORAL | Status: DC

## 2016-12-13 MED ORDER — DOPAMINE-DEXTROSE 3.2-5 MG/ML-% IV SOLN: 0.0000 ug/kg/min | INTRAVENOUS | Status: DC

## 2016-12-13 MED ORDER — SODIUM CHLORIDE 0.9 % IV SOLN
10.0000 mg/h | INTRAVENOUS | Status: DC
  Administered 2016-12-13: 10 mg/h via INTRAVENOUS
  Filled 2016-12-13: qty 10

## 2016-12-13 MED ORDER — FENTANYL CITRATE (PF) 100 MCG/2ML IJ SOLN: 25.0000 ug | INTRAMUSCULAR | Status: DC | PRN

## 2016-12-13 MED ORDER — ASPIRIN 81 MG PO CHEW
81.0000 mg | CHEWABLE_TABLET | Freq: Every day | ORAL | Status: DC
  Administered 2016-12-13: 81 mg via ORAL
  Filled 2016-12-13: qty 1

## 2016-12-13 MED ORDER — HEPARIN SODIUM (PORCINE) 5000 UNIT/ML IJ SOLN: 5000.0000 [IU] | Freq: Three times a day (TID) | INTRAMUSCULAR | Status: DC

## 2016-12-13 MED ORDER — SODIUM CHLORIDE 0.9 % IV SOLN: 250.0000 mL | INTRAVENOUS | Status: DC | PRN

## 2016-12-13 MED ORDER — MORPHINE BOLUS VIA INFUSION
5.0000 mg | INTRAVENOUS | Status: DC | PRN
  Filled 2016-12-13: qty 20

## 2016-12-17 ENCOUNTER — Telehealth: Payer: Self-pay

## 2016-12-17 NOTE — Telephone Encounter (Signed)
On Jan 02, 2017 I received a death certificate from [facility] (original). The death certificate is for burial. The patient is a patient of Doctor Vassie Loll. The death certificate will be taken to Pulmonary Unit @ [facility] this am for signature.  On 01/02/17 I received the death certificate back from Doctor Vassie Loll. I got the death certificate ready and called the funeral home to let them know I mailed the death certificate to vital records per the funeral home request.

## 2017-01-08 NOTE — Progress Notes (Signed)
Pt admitted from [facility] via [place] after report received from [place], [place] at [facility]. Pt attached to unit monitor and ventilator, CHG bath performed as well as MRSA PCR swab sent. OGT placed per verbal order and attached to LIWS with 700 cc of coffee ground drainage noted. MD notified. CHG mouth swab done. See assessment, family updated per MD and to be brought to bedside.

## 2017-01-08 NOTE — H&P (Signed)
PULMONARY / CRITICAL CARE MEDICINE   Name: Eugene Mack MRN: [id number] DOB: 24-Sep-1940    ADMISSION DATE:  2017-01-12  CHIEF COMPLAINT:  Cardiac arrest  HISTORY OF PRESENT ILLNESS:   Eugene Mack is a 76 y/o man with a history of vascular disease and stroke. He was reportedly well yesterday but during the night awoke to chest discomfort and dyspnea, and EMS was called. He was found pulseless in asystole. His total downtime was unknown, but ROSC was achieved by EMS after about ~20 minutes receiving 2 mg of epinephrine. His initial blood gas shwoed a pH of 6.8, and he had know apparent neurologic function. He was transferred to [facility].  PAST MEDICAL HISTORY :  He  has a past medical history of Carotid artery occlusion; Diarrhea (05/05/2010); Hyperlipidemia; Hypertension; IBS (irritable bowel syndrome); Leg pain; and TIA (transient ischemic attack).  PAST SURGICAL HISTORY: He  has a past surgical history that includes Carotid endarterectomy (10/03/2009); Strabismus surgery; Cataract extraction w/PHACO (Right, 07/13/2013); and Cataract extraction w/PHACO (Left, 07/31/2013).  No Known Allergies  No current facility-administered medications on file prior to encounter.    Current Outpatient Prescriptions on File Prior to Encounter  Medication Sig  . amLODipine (NORVASC) 5 MG tablet Take 5 mg by mouth daily.  Marland Kitchen atorvastatin (LIPITOR) 40 MG tablet Take 40 mg by mouth daily.    FAMILY HISTORY:  His indicated that his mother is deceased. He indicated that his father is deceased.    SOCIAL HISTORY: He  reports that he has been smoking Cigarettes.  He has a 90.00 pack-year smoking history. He does not have any smokeless tobacco history on file. He reports that he does not drink alcohol or use drugs.   VITAL SIGNS: BP (!) 103/59   Pulse 88   Temp 97.6 F (36.4 C) (Axillary)   Resp (!) 22   Ht 5\' 4"  (1.626 m)   Wt 138 lb 0.1 oz (62.6 kg)   SpO2 98%   BMI 23.69 kg/m   HEMODYNAMICS:     VENTILATOR SETTINGS: Vent Mode: PRVC FiO2 (%):  [80 %-100 %] 80 % Set Rate:  [16 bmp-30 bmp] 30 bmp Vt Set:  [470 mL] 470 mL PEEP:  [5 cmH20] 5 cmH20 Plateau Pressure:  [16 cmH20] 16 cmH20  INTAKE / OUTPUT: I/O last 3 completed shifts: In: 5.3 [I.V.:5.3] Out: 700 [Emesis/NG output:700]  PHYSICAL EXAMINATION: General:  Elderly man, intubated Neuro:  No CN reflexes. Weak breath initiation after ~5 seconds of apnea. HEENT:  Dry MM Cardiovascular:  Weak pulse, cool extremities Lungs:  CTA Abdomen:  Soft Musculoskeletal:  No deformities Skin:  NO rashes on visible skin  LABS:  BMET No results for input(s): NA, K, CL, CO2, BUN, CREATININE, GLUCOSE in the last 168 hours.  Electrolytes No results for input(s): CALCIUM, MG, PHOS in the last 168 hours.  CBC No results for input(s): WBC, HGB, HCT, PLT in the last 168 hours.  Coag's No results for input(s): APTT, INR in the last 168 hours.  Sepsis Markers No results for input(s): LATICACIDVEN, PROCALCITON, O2SATVEN in the last 168 hours.  ABG  Recent Labs Lab 01-12-17 0654  PHART 7.185*  PCO2ART 46.8  PO2ART 178.0*    Liver Enzymes No results for input(s): AST, ALT, ALKPHOS, BILITOT, ALBUMIN in the last 168 hours.  Cardiac Enzymes No results for input(s): TROPONINI, PROBNP in the last 168 hours.  Glucose No results for input(s): GLUCAP in the last 168 hours.  Imaging Dg Abd 1 View  Result Date: Dec 22, 2016 CLINICAL DATA:  Orogastric tube placement EXAM: ABDOMEN - 1 VIEW COMPARISON:  None. FINDINGS: The enteric tube extends well into the stomach with tip in the region of the distal gastric body. IMPRESSION: Enteric tube extends well into the stomach. Electronically Signed   By: Ellery Plunk M.D.   On: 2016/12/22 07:02   Dg Chest Port 1 View  Result Date: 12/22/2016 CLINICAL DATA:  Respiratory failure EXAM: PORTABLE CHEST 1 VIEW COMPARISON:  12/22/16 at 03:51 FINDINGS: The endotracheal tube has been  repositioned and now is 2.6 cm above the carina, no longer directed toward the right mainstem bronchus. The enteric tube extends into the stomach and beyond the inferior edge of the image. Patchy airspace opacities persist in the right base and left upper lobe. No pneumothorax. Probable small left pleural effusion. IMPRESSION: 1.  Support equipment appears satisfactorily positioned. 2. No significant interval change in the bilateral airspace opacities. Electronically Signed   By: Ellery Plunk M.D.   On: 22-Dec-2016 07:01    ASSESSMENT / PLAN:  76 y/o man with cardiac arrest and probable neurologic devastation. Unclear etiology. Given lack of extended prodrome, primary cardiac event is favored over primary respiratory event, however, no STEMI seen on ECG.  Discussed with family. Will be DNR now, and await results of basic labs. If suggestive of metabolic devastation, than will recommend withdrawal of life sustaining measures, to which the family seemed open to doing in that event.   CRITICAL CARE Performed by: Jamie Kato   Total critical care time: 60 minutes  Critical care time was exclusive of separately billable procedures and treating other patients.  Critical care was necessary to treat or prevent imminent or life-threatening deterioration.  Critical care was time spent personally by me on the following activities: development of treatment plan with patient and/or surrogate as well as nursing, discussions with consultants, evaluation of patient's response to treatment, examination of patient, obtaining history from patient or surrogate, ordering and performing treatments and interventions, ordering and review of laboratory studies, ordering and review of radiographic studies, pulse oximetry and re-evaluation of patient's condition.  Jamie Kato, MD Pulmonary and Critical Care Medicine [facility] Pager: [phone]  22-Dec-2016, 7:34 AM

## 2017-01-08 NOTE — Significant Event (Signed)
Patient without pulse, heart tones, respiratory effort or pupil response.  Time of death 1850 Aline August RN       Aliene Altes

## 2017-01-08 NOTE — Progress Notes (Signed)
   [facility]  > extensively spoke and updated wife and children and rest of family.  They want to transition pt to comfort care.    Pollie Meyer, MD 2016-12-25, 5:55 PM [place] [facility] Pager [phone] After 3 pm or if no answer, call [phone]

## 2017-01-08 NOTE — Progress Notes (Signed)
Critical ABG results called to the box, given pager number from critical care and paged MD on call.

## 2017-01-08 NOTE — Discharge Summary (Signed)
PULMONARY / CRITICAL CARE MEDICINE   Name: Eugene Mack MRN: [id number] DOB: [birth year]    ADMISSION DATE:  Dec 26, 2016  CHIEF COMPLAINT:  Cardiac arrest  HISTORY OF PRESENT ILLNESS:   Eugene Mack is a 76 y/o man with a history of vascular disease and stroke. He was reportedly well yesterday but during the night awoke to chest discomfort and dyspnea, and EMS was called. He was found pulseless in asystole. His total downtime was unknown, but ROSC was achieved by EMS after about ~20 minutes receiving 2 mg of epinephrine. His initial blood gas showed a pH of 6.8, and he was transferred to [facility].  PAST MEDICAL HISTORY :  He  has a past medical history of Carotid artery occlusion; Diarrhea (05/05/2010); Hyperlipidemia; Hypertension; IBS (irritable bowel syndrome); Leg pain; and TIA (transient ischemic attack).  PAST SURGICAL HISTORY: He  has a past surgical history that includes Carotid endarterectomy (10/03/2009); Strabismus surgery; Cataract extraction w/PHACO (Right, 07/13/2013); and Cataract extraction w/PHACO (Left, 07/31/2013).  No Known Allergies  No current facility-administered medications on file prior to encounter.    Current Outpatient Prescriptions on File Prior to Encounter  Medication Sig  . amLODipine (NORVASC) 5 MG tablet Take 5 mg by mouth daily.  Marland Kitchen atorvastatin (LIPITOR) 40 MG tablet Take 40 mg by mouth daily.    FAMILY HISTORY:  His indicated that his mother is deceased. He indicated that his father is deceased.    SOCIAL HISTORY: He  reports that he has been smoking Cigarettes.  He has a 90.00 pack-year smoking history. He does not have any smokeless tobacco history on file. He reports that he does not drink alcohol or use drugs.   VITAL SIGNS: BP (!) 47/37   Pulse (!) 107   Temp (!) 90.8 F (32.7 C) (Rectal)   Resp (!) 0   Ht 5\' 4"  (1.626 m)   Wt 138 lb 0.1 oz (62.6 kg)   SpO2 90%   BMI 23.69 kg/m   HEMODYNAMICS:    VENTILATOR SETTINGS: Vent Mode:  PRVC FiO2 (%):  [70 %-80 %] 80 % Set Rate:  [25 bmp] 25 bmp Vt Set:  [470 mL] 470 mL PEEP:  [5 cmH20] 5 cmH20 Plateau Pressure:  [15 cmH20-18 cmH20] 15 cmH20  INTAKE / OUTPUT: I/O last 3 completed shifts: In: 5.3 [I.V.:5.3] Out: 700 [Emesis/NG output:700]  PHYSICAL EXAMINATION: General:  Elderly man, intubated Neuro:  No CN reflexes.pupils 68mm fixed NRTL Weak breath initiation after ~5 seconds of apnea. HEENT:  Dry MM Cardiovascular:  Weak pulse, cool extremities Lungs:  CTA Abdomen:  Soft Musculoskeletal:  No deformities Skin:  NO rashes on visible skin  LABS:  BMET  Recent Labs Lab 12/26/2016 0654  NA 142  K 5.8*  CL 109  CO2 17*  BUN 30*  CREATININE 3.39*  GLUCOSE 227*    Electrolytes  Recent Labs Lab Dec 26, 2016 0654  CALCIUM 7.8*  MG 2.0  PHOS 8.4*    CBC  Recent Labs Lab Dec 26, 2016 0654  WBC 25.7*  HGB 10.4*  HCT 33.1*  PLT 169    Coag's  Recent Labs Lab 12-26-16 0654  INR 2.14    Sepsis Markers  Recent Labs Lab 2016-12-26 0654  LATICACIDVEN 7.3*    ABG  Recent Labs Lab 12-26-16 0654  PHART 7.185*  PCO2ART 46.8  PO2ART 178.0*    Liver Enzymes  Recent Labs Lab 26-Dec-2016 0654  AST 230*  ALT 117*  ALKPHOS 120  BILITOT 0.6  ALBUMIN 2.6*  Cardiac Enzymes  Recent Labs Lab Jan 09, 2017 0654 01/09/17 1246  TROPONINI 0.98* 8.80*    Glucose No results for input(s): GLUCAP in the last 168 hours.  Imaging No results found.  ASSESSMENT / PLAN:  75 y/o man with cardiac arrest and probable neurologic devastation. Unclear etiology. Given lack of extended prodrome, primary cardiac event is favored over primary respiratory event, however, no STEMI seen on ECG. Dark blood aspirated from NGT s/o UGI bleed - again unclear if this is was cause or effect  Discussed with extensive family including children - wife has dementia.  All questions answered , DNR & no escalation issued, cap dopamine @ 20 mcg. Initla labs showed lactic  acidosis, pos troponins, leucocytosis, hyperkalemia & AKI.  After further discussion, family decided to withdraw life support & he passed away soon after. Addn cc time x  Cause of death - Acute myocardial infarction,Coronary artery disease, CVA    Cyril Mourning MD. FCCP. [facility] Pager [phone] If no response call [phone]    12/14/2016, 7:31 AM

## 2017-01-08 NOTE — Progress Notes (Signed)
eLink Physician-Brief Progress Note Patient Name: [name] DOB: 1941/01/20 MRN: [id number]   Date of Service  12/26/2016  HPI/Events of Note  Troponin = 0.98 >> 8.80. s/p Cardiac Arrest. No noted neurological recover post code. Prognosis poor.  Patient is a DNR.  eICU Interventions  Will order: 1. ASA 81 mg per tube now and Q day.  2. Continue to trend Troponin.     Intervention Category Intermediate Interventions: Diagnostic test evaluation  [name] 12-26-16, 4:03 PM

## 2017-01-08 NOTE — Procedures (Signed)
Extubation Procedure Note   Pt extubated per MD order at this time.     Cherylin Mylar 12/21/2016, 6:25 PM

## 2017-01-08 NOTE — Progress Notes (Signed)
ETT withdrawn from 28 at the lip to 24 per Dr. Eugenia Pancoast

## 2017-01-08 DEATH — deceased

## 2019-04-29 IMAGING — CR DG ABDOMEN 1V
1 series · 1 of 1 positions shown · non-contrast
Comparison: None.

CLINICAL DATA: Orogastric tube placement

EXAM:
ABDOMEN - 1 VIEW

[AP]
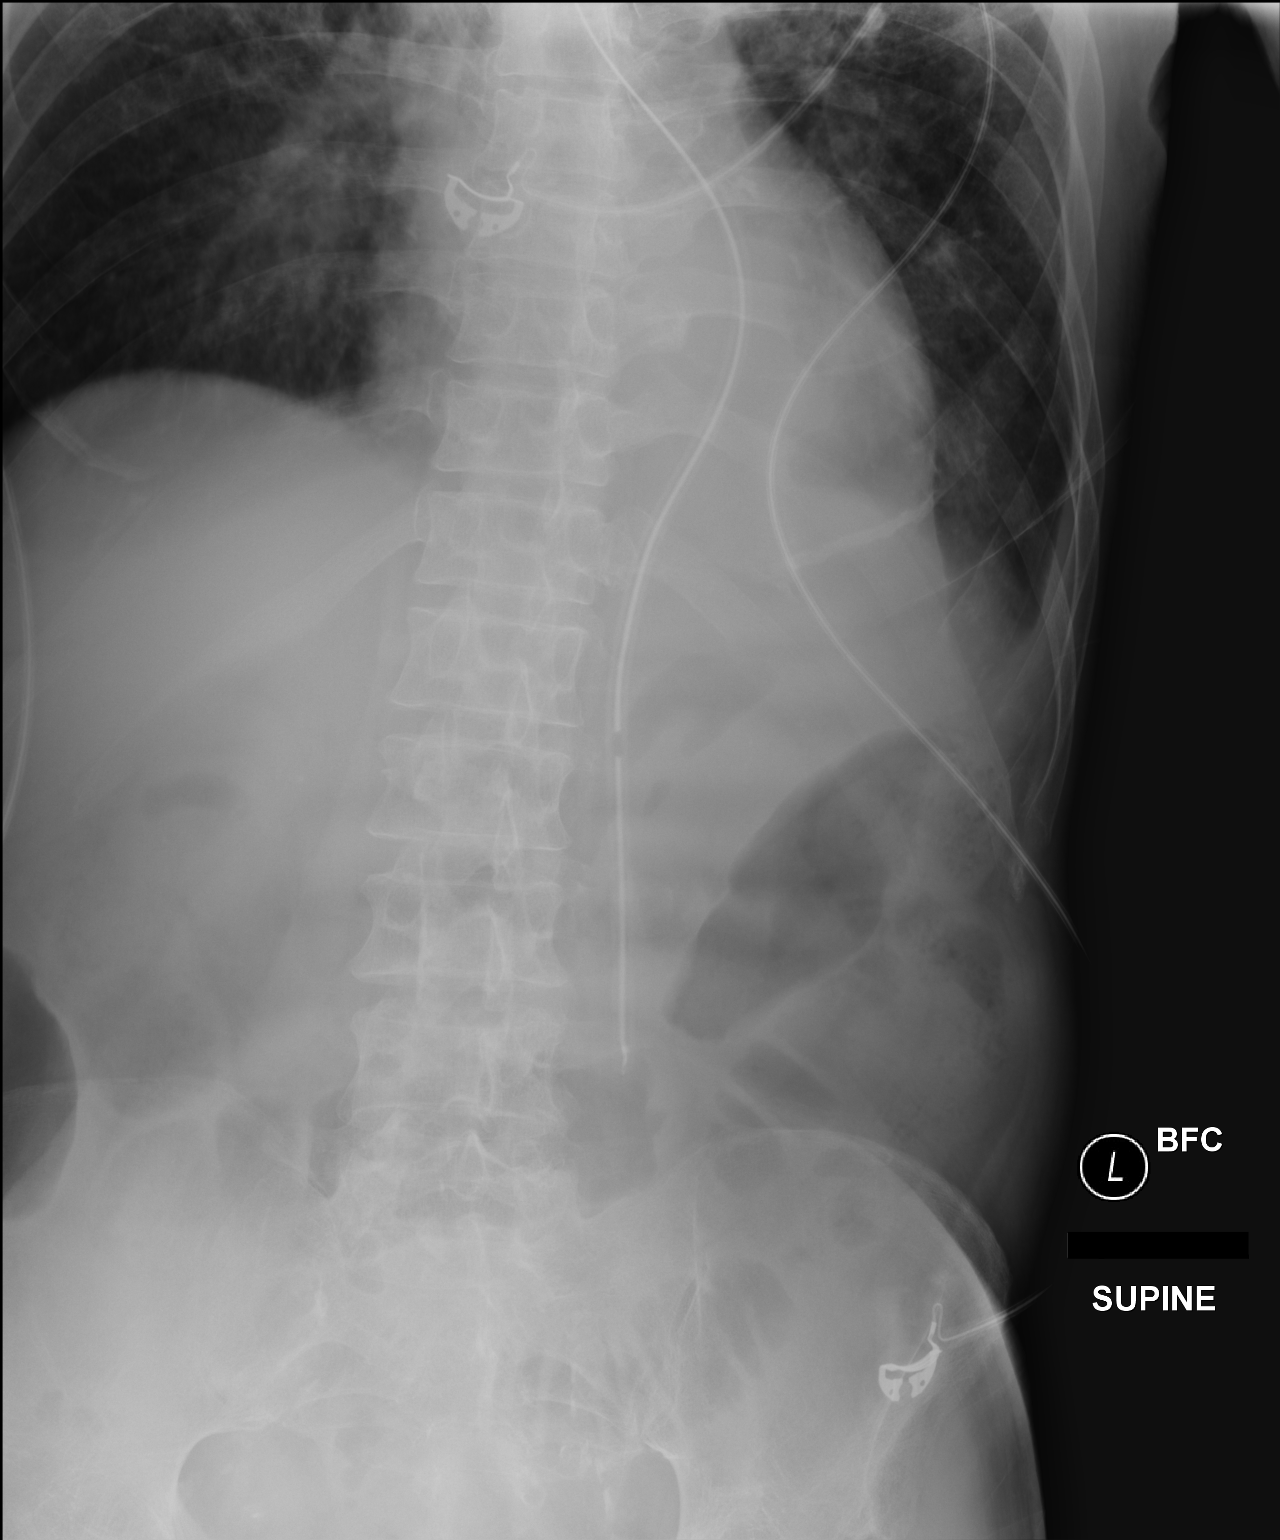

[1 of 1 positions shown; findings below may reference images not displayed]

FINDINGS: The enteric tube extends well into the stomach with tip in the
region of the distal gastric body.
IMPRESSION: Enteric tube extends well into the stomach.
